# Patient Record
Sex: Female | Born: 1950 | Hispanic: Refuse to answer | Smoking: Never smoker
Health system: Southern US, Community
[De-identification: ages and names within clinical notes are randomized; demographics above are authoritative.]

## PROBLEM LIST (undated history)

## (undated) DIAGNOSIS — E079 Disorder of thyroid, unspecified: Secondary | ICD-10-CM

## (undated) DIAGNOSIS — I10 Essential (primary) hypertension: Secondary | ICD-10-CM

## (undated) DIAGNOSIS — E785 Hyperlipidemia, unspecified: Secondary | ICD-10-CM

## (undated) HISTORY — PX: TUBAL LIGATION: SHX77

---

## 2001-05-12 ENCOUNTER — Other Ambulatory Visit: Admission: RE | Admit: 2001-05-12 | Discharge: 2001-05-12 | Payer: Self-pay | Admitting: Obstetrics and Gynecology

## 2002-07-01 ENCOUNTER — Ambulatory Visit (HOSPITAL_COMMUNITY): Admission: RE | Admit: 2002-07-01 | Discharge: 2002-07-01 | Payer: Self-pay | Admitting: Obstetrics and Gynecology

## 2002-07-01 ENCOUNTER — Encounter: Payer: Self-pay | Admitting: Obstetrics and Gynecology

## 2002-09-24 ENCOUNTER — Encounter: Payer: Self-pay | Admitting: Obstetrics and Gynecology

## 2002-09-24 ENCOUNTER — Encounter: Admission: RE | Admit: 2002-09-24 | Discharge: 2002-09-24 | Payer: Self-pay | Admitting: Obstetrics and Gynecology

## 2005-01-04 ENCOUNTER — Ambulatory Visit (HOSPITAL_COMMUNITY): Admission: RE | Admit: 2005-01-04 | Discharge: 2005-01-04 | Payer: Self-pay | Admitting: Obstetrics and Gynecology

## 2006-03-06 ENCOUNTER — Ambulatory Visit (HOSPITAL_COMMUNITY): Admission: RE | Admit: 2006-03-06 | Discharge: 2006-03-06 | Payer: Self-pay | Admitting: Internal Medicine

## 2007-04-16 ENCOUNTER — Ambulatory Visit (HOSPITAL_COMMUNITY): Admission: RE | Admit: 2007-04-16 | Discharge: 2007-04-16 | Payer: Self-pay | Admitting: Family Medicine

## 2008-04-29 ENCOUNTER — Ambulatory Visit (HOSPITAL_COMMUNITY): Admission: RE | Admit: 2008-04-29 | Discharge: 2008-04-29 | Payer: Self-pay | Admitting: Internal Medicine

## 2009-04-28 ENCOUNTER — Ambulatory Visit (HOSPITAL_COMMUNITY): Admission: RE | Admit: 2009-04-28 | Discharge: 2009-04-28 | Payer: Self-pay | Admitting: Internal Medicine

## 2010-01-25 ENCOUNTER — Emergency Department (HOSPITAL_COMMUNITY): Admission: EM | Admit: 2010-01-25 | Discharge: 2010-01-25 | Payer: Self-pay | Admitting: Family Medicine

## 2010-07-12 ENCOUNTER — Other Ambulatory Visit (HOSPITAL_COMMUNITY): Payer: Self-pay | Admitting: Psychology

## 2010-07-12 DIAGNOSIS — Z1231 Encounter for screening mammogram for malignant neoplasm of breast: Secondary | ICD-10-CM

## 2010-07-26 ENCOUNTER — Ambulatory Visit (HOSPITAL_COMMUNITY)
Admission: RE | Admit: 2010-07-26 | Discharge: 2010-07-26 | Disposition: A | Discharge status: Home / Self Care | Payer: Self-pay | Source: Ambulatory Visit | Attending: Psychology | Admitting: Psychology

## 2010-07-26 DIAGNOSIS — Z1231 Encounter for screening mammogram for malignant neoplasm of breast: Secondary | ICD-10-CM

## 2011-07-04 ENCOUNTER — Other Ambulatory Visit (HOSPITAL_COMMUNITY): Payer: Self-pay | Admitting: Psychology

## 2011-07-04 DIAGNOSIS — Z1231 Encounter for screening mammogram for malignant neoplasm of breast: Secondary | ICD-10-CM

## 2011-07-30 ENCOUNTER — Ambulatory Visit (HOSPITAL_COMMUNITY)
Admission: RE | Admit: 2011-07-30 | Discharge: 2011-07-30 | Disposition: A | Discharge status: Home / Self Care | Payer: Self-pay | Source: Ambulatory Visit | Attending: Psychology | Admitting: Psychology

## 2011-07-30 DIAGNOSIS — Z1231 Encounter for screening mammogram for malignant neoplasm of breast: Secondary | ICD-10-CM | POA: Insufficient documentation

## 2012-07-02 ENCOUNTER — Other Ambulatory Visit (HOSPITAL_COMMUNITY): Payer: Self-pay | Admitting: Unknown Physician Specialty

## 2012-10-03 ENCOUNTER — Other Ambulatory Visit (HOSPITAL_COMMUNITY): Payer: Self-pay | Admitting: Unknown Physician Specialty

## 2012-10-03 ENCOUNTER — Other Ambulatory Visit (HOSPITAL_COMMUNITY): Payer: Self-pay | Admitting: *Deleted

## 2012-10-03 DIAGNOSIS — Z1231 Encounter for screening mammogram for malignant neoplasm of breast: Secondary | ICD-10-CM

## 2012-10-17 ENCOUNTER — Ambulatory Visit (HOSPITAL_COMMUNITY)
Admission: RE | Admit: 2012-10-17 | Discharge: 2012-10-17 | Disposition: A | Discharge status: Home / Self Care | Payer: Self-pay | Source: Ambulatory Visit | Attending: Unknown Physician Specialty | Admitting: Unknown Physician Specialty

## 2012-10-17 ENCOUNTER — Ambulatory Visit (HOSPITAL_COMMUNITY): Payer: Self-pay

## 2012-10-17 DIAGNOSIS — Z1231 Encounter for screening mammogram for malignant neoplasm of breast: Secondary | ICD-10-CM

## 2013-02-23 ENCOUNTER — Ambulatory Visit: Payer: Self-pay

## 2013-09-23 ENCOUNTER — Other Ambulatory Visit (HOSPITAL_COMMUNITY): Payer: Self-pay | Admitting: Unknown Physician Specialty

## 2013-10-20 ENCOUNTER — Other Ambulatory Visit (HOSPITAL_COMMUNITY): Payer: Self-pay | Admitting: Unknown Physician Specialty

## 2013-10-20 DIAGNOSIS — Z1231 Encounter for screening mammogram for malignant neoplasm of breast: Secondary | ICD-10-CM

## 2013-10-22 ENCOUNTER — Ambulatory Visit (HOSPITAL_COMMUNITY): Payer: Self-pay

## 2013-10-22 ENCOUNTER — Ambulatory Visit (HOSPITAL_COMMUNITY)
Admission: RE | Admit: 2013-10-22 | Discharge: 2013-10-22 | Disposition: A | Discharge status: Home / Self Care | Payer: Self-pay | Source: Ambulatory Visit | Attending: Unknown Physician Specialty | Admitting: Unknown Physician Specialty

## 2013-10-22 DIAGNOSIS — Z1231 Encounter for screening mammogram for malignant neoplasm of breast: Secondary | ICD-10-CM

## 2014-09-30 ENCOUNTER — Other Ambulatory Visit (HOSPITAL_COMMUNITY): Payer: Self-pay | Admitting: Unknown Physician Specialty

## 2014-10-25 ENCOUNTER — Other Ambulatory Visit (HOSPITAL_COMMUNITY): Payer: Self-pay | Admitting: Unknown Physician Specialty

## 2014-10-25 DIAGNOSIS — Z1231 Encounter for screening mammogram for malignant neoplasm of breast: Secondary | ICD-10-CM

## 2014-11-03 ENCOUNTER — Ambulatory Visit (HOSPITAL_COMMUNITY)
Admission: RE | Admit: 2014-11-03 | Discharge: 2014-11-03 | Disposition: A | Discharge status: Home / Self Care | Payer: Self-pay | Source: Ambulatory Visit | Attending: Unknown Physician Specialty | Admitting: Unknown Physician Specialty

## 2014-11-03 DIAGNOSIS — Z1231 Encounter for screening mammogram for malignant neoplasm of breast: Secondary | ICD-10-CM

## 2015-10-04 ENCOUNTER — Other Ambulatory Visit: Payer: Self-pay | Admitting: Family Medicine

## 2015-10-04 DIAGNOSIS — Z1231 Encounter for screening mammogram for malignant neoplasm of breast: Secondary | ICD-10-CM

## 2015-10-20 ENCOUNTER — Other Ambulatory Visit: Payer: Self-pay | Admitting: Obstetrics and Gynecology

## 2015-10-20 DIAGNOSIS — Z1231 Encounter for screening mammogram for malignant neoplasm of breast: Secondary | ICD-10-CM

## 2015-11-23 ENCOUNTER — Telehealth (HOSPITAL_COMMUNITY): Payer: Self-pay | Admitting: *Deleted

## 2015-11-23 NOTE — Telephone Encounter (Signed)
Telephoned patient at home number and someone picked up the phone and hung up. Patient then called backed and was able to confirm appointment for Thursday August 17 3:30.

## 2015-11-24 ENCOUNTER — Encounter (HOSPITAL_COMMUNITY): Payer: Self-pay

## 2015-11-24 ENCOUNTER — Ambulatory Visit
Admission: RE | Admit: 2015-11-24 | Discharge: 2015-11-24 | Disposition: A | Discharge status: Home / Self Care | Payer: No Typology Code available for payment source | Source: Ambulatory Visit | Attending: Obstetrics and Gynecology | Admitting: Obstetrics and Gynecology

## 2015-11-24 ENCOUNTER — Ambulatory Visit (HOSPITAL_COMMUNITY)
Admission: RE | Admit: 2015-11-24 | Discharge: 2015-11-24 | Disposition: A | Discharge status: Home / Self Care | Payer: Self-pay | Source: Ambulatory Visit | Attending: Obstetrics and Gynecology | Admitting: Obstetrics and Gynecology

## 2015-11-24 VITALS — BP 118/80 | Temp 98.5°F | Ht 63.5 in | Wt 215.0 lb

## 2015-11-24 DIAGNOSIS — Z1231 Encounter for screening mammogram for malignant neoplasm of breast: Secondary | ICD-10-CM

## 2015-11-24 DIAGNOSIS — Z01419 Encounter for gynecological examination (general) (routine) without abnormal findings: Secondary | ICD-10-CM

## 2015-11-24 HISTORY — DX: Essential (primary) hypertension: I10

## 2015-11-24 HISTORY — DX: Disorder of thyroid, unspecified: E07.9

## 2015-11-24 HISTORY — DX: Hyperlipidemia, unspecified: E78.5

## 2015-11-24 NOTE — Patient Instructions (Signed)
Explained breast self awareness to Lajoyce Lauber. Let her know [facility] will cover Pap smears and HPV typing every 5 years unless has a history of abnormal Pap smears. Referred patient to the [facility] for a screening mammogram. Appointment scheduled for Thursday, November 24, 2015 at 1640. Let [facility] know [facility] will follow up with her within the next couple weeks with results by letter or phone. Standley Brooking Cordero verbalized understanding.  Annette Bertelson, Arvil Chaco, RN 4:43 PM

## 2015-11-24 NOTE — Progress Notes (Signed)
No complaints today.   Pap Smear: Pap smear completed today. Last Pap smear was 10 years ago and normal per patient. Per patient has no history of an abnormal Pap smear. No Pap smear results are in EPIC.  Physical exam: Breasts Breasts symmetrical. No skin abnormalities bilateral breasts. No nipple retraction bilateral breasts. No nipple discharge bilateral breasts. No lymphadenopathy. No lumps palpated bilateral breasts. No complaints of pain or tenderness on exam. Referred patient to the [facility] for a screening mammogram. Appointment scheduled for Thursday, November 24, 2015 at 1640.  Pelvic/Bimanual   Ext Genitalia No lesions, no swelling and no discharge observed on external genitalia.         Vagina Vagina pink and normal texture. No lesions or discharge observed in vagina.          Cervix Cervix is present. Cervix pink and of normal texture. No discharge observed.     Uterus Uterus is present and palpable. Uterus in normal position and normal size.        Adnexae Bilateral ovaries present and palpable. No tenderness on palpation.          Rectovaginal No rectal exam completed today since patient had no rectal complaints. No skin abnormalities observed on exam.    Smoking History: Patient has never smoked.  Patient Navigation: Patient education provided. Access to services provided for patient through [facility] program.   Colorectal Cancer Screening: Patient has never had a colonoscopy. No complaints today.

## 2015-11-24 NOTE — Progress Notes (Signed)
.  pap

## 2015-11-28 ENCOUNTER — Encounter (HOSPITAL_COMMUNITY): Payer: Self-pay | Admitting: *Deleted

## 2015-11-28 LAB — CYTOLOGY - PAP

## 2015-11-29 ENCOUNTER — Telehealth (HOSPITAL_COMMUNITY): Payer: Self-pay | Admitting: *Deleted

## 2015-11-29 NOTE — Telephone Encounter (Signed)
Telephoned patient at home number and discussed negative pap smear results. HPV was negative. Next pap smear due in five years according to guide lines. Patient voiced understanding. No further questions.

## 2015-12-05 DIAGNOSIS — Z1211 Encounter for screening for malignant neoplasm of colon: Secondary | ICD-10-CM | POA: Diagnosis not present

## 2015-12-05 DIAGNOSIS — E039 Hypothyroidism, unspecified: Secondary | ICD-10-CM | POA: Diagnosis not present

## 2015-12-05 DIAGNOSIS — Z1389 Encounter for screening for other disorder: Secondary | ICD-10-CM | POA: Diagnosis not present

## 2015-12-05 DIAGNOSIS — Z9181 History of falling: Secondary | ICD-10-CM | POA: Diagnosis not present

## 2015-12-05 DIAGNOSIS — Z6838 Body mass index (BMI) 38.0-38.9, adult: Secondary | ICD-10-CM | POA: Diagnosis not present

## 2015-12-05 DIAGNOSIS — I10 Essential (primary) hypertension: Secondary | ICD-10-CM | POA: Diagnosis not present

## 2015-12-05 DIAGNOSIS — E785 Hyperlipidemia, unspecified: Secondary | ICD-10-CM | POA: Diagnosis not present

## 2015-12-05 DIAGNOSIS — E669 Obesity, unspecified: Secondary | ICD-10-CM | POA: Diagnosis not present

## 2015-12-20 DIAGNOSIS — T8131XS Disruption of external operation (surgical) wound, not elsewhere classified, sequela: Secondary | ICD-10-CM | POA: Diagnosis not present

## 2015-12-20 DIAGNOSIS — T8131XA Disruption of external operation (surgical) wound, not elsewhere classified, initial encounter: Secondary | ICD-10-CM | POA: Diagnosis not present

## 2015-12-20 DIAGNOSIS — Y838 Other surgical procedures as the cause of abnormal reaction of the patient, or of later complication, without mention of misadventure at the time of the procedure: Secondary | ICD-10-CM | POA: Diagnosis not present

## 2015-12-20 DIAGNOSIS — I10 Essential (primary) hypertension: Secondary | ICD-10-CM | POA: Diagnosis not present

## 2015-12-27 DIAGNOSIS — T8131XA Disruption of external operation (surgical) wound, not elsewhere classified, initial encounter: Secondary | ICD-10-CM | POA: Diagnosis not present

## 2015-12-27 DIAGNOSIS — Y838 Other surgical procedures as the cause of abnormal reaction of the patient, or of later complication, without mention of misadventure at the time of the procedure: Secondary | ICD-10-CM | POA: Diagnosis not present

## 2015-12-27 DIAGNOSIS — T8131XS Disruption of external operation (surgical) wound, not elsewhere classified, sequela: Secondary | ICD-10-CM | POA: Diagnosis not present

## 2015-12-27 DIAGNOSIS — S31609A Unspecified open wound of abdominal wall, unspecified quadrant with penetration into peritoneal cavity, initial encounter: Secondary | ICD-10-CM | POA: Diagnosis not present

## 2016-01-04 DIAGNOSIS — Y838 Other surgical procedures as the cause of abnormal reaction of the patient, or of later complication, without mention of misadventure at the time of the procedure: Secondary | ICD-10-CM | POA: Diagnosis not present

## 2016-01-04 DIAGNOSIS — T8131XS Disruption of external operation (surgical) wound, not elsewhere classified, sequela: Secondary | ICD-10-CM | POA: Diagnosis not present

## 2016-01-04 DIAGNOSIS — T8132XA Disruption of internal operation (surgical) wound, not elsewhere classified, initial encounter: Secondary | ICD-10-CM | POA: Diagnosis not present

## 2016-01-11 DIAGNOSIS — Z872 Personal history of diseases of the skin and subcutaneous tissue: Secondary | ICD-10-CM | POA: Diagnosis not present

## 2016-01-11 DIAGNOSIS — T8131XS Disruption of external operation (surgical) wound, not elsewhere classified, sequela: Secondary | ICD-10-CM | POA: Diagnosis not present

## 2016-01-11 DIAGNOSIS — Z09 Encounter for follow-up examination after completed treatment for conditions other than malignant neoplasm: Secondary | ICD-10-CM | POA: Diagnosis not present

## 2016-01-13 DIAGNOSIS — Z1211 Encounter for screening for malignant neoplasm of colon: Secondary | ICD-10-CM | POA: Diagnosis not present

## 2016-02-02 DIAGNOSIS — T8131XS Disruption of external operation (surgical) wound, not elsewhere classified, sequela: Secondary | ICD-10-CM | POA: Diagnosis not present

## 2016-02-02 DIAGNOSIS — Y838 Other surgical procedures as the cause of abnormal reaction of the patient, or of later complication, without mention of misadventure at the time of the procedure: Secondary | ICD-10-CM | POA: Diagnosis not present

## 2016-02-02 DIAGNOSIS — S31102A Unspecified open wound of abdominal wall, epigastric region without penetration into peritoneal cavity, initial encounter: Secondary | ICD-10-CM | POA: Diagnosis not present

## 2016-02-03 DIAGNOSIS — S31609A Unspecified open wound of abdominal wall, unspecified quadrant with penetration into peritoneal cavity, initial encounter: Secondary | ICD-10-CM | POA: Diagnosis not present

## 2016-02-07 DIAGNOSIS — Y838 Other surgical procedures as the cause of abnormal reaction of the patient, or of later complication, without mention of misadventure at the time of the procedure: Secondary | ICD-10-CM | POA: Diagnosis not present

## 2016-02-07 DIAGNOSIS — T8131XA Disruption of external operation (surgical) wound, not elsewhere classified, initial encounter: Secondary | ICD-10-CM | POA: Diagnosis not present

## 2016-02-07 DIAGNOSIS — T8131XS Disruption of external operation (surgical) wound, not elsewhere classified, sequela: Secondary | ICD-10-CM | POA: Diagnosis not present

## 2016-02-15 DIAGNOSIS — T8131XS Disruption of external operation (surgical) wound, not elsewhere classified, sequela: Secondary | ICD-10-CM | POA: Diagnosis not present

## 2016-02-15 DIAGNOSIS — Y838 Other surgical procedures as the cause of abnormal reaction of the patient, or of later complication, without mention of misadventure at the time of the procedure: Secondary | ICD-10-CM | POA: Diagnosis not present

## 2016-02-15 DIAGNOSIS — T8131XA Disruption of external operation (surgical) wound, not elsewhere classified, initial encounter: Secondary | ICD-10-CM | POA: Diagnosis not present

## 2016-02-22 DIAGNOSIS — Y839 Surgical procedure, unspecified as the cause of abnormal reaction of the patient, or of later complication, without mention of misadventure at the time of the procedure: Secondary | ICD-10-CM | POA: Diagnosis not present

## 2016-02-22 DIAGNOSIS — T8131XA Disruption of external operation (surgical) wound, not elsewhere classified, initial encounter: Secondary | ICD-10-CM | POA: Diagnosis not present

## 2016-02-22 DIAGNOSIS — S31102D Unspecified open wound of abdominal wall, epigastric region without penetration into peritoneal cavity, subsequent encounter: Secondary | ICD-10-CM | POA: Diagnosis not present

## 2016-02-29 DIAGNOSIS — T8131XD Disruption of external operation (surgical) wound, not elsewhere classified, subsequent encounter: Secondary | ICD-10-CM | POA: Diagnosis not present

## 2016-02-29 DIAGNOSIS — Y838 Other surgical procedures as the cause of abnormal reaction of the patient, or of later complication, without mention of misadventure at the time of the procedure: Secondary | ICD-10-CM | POA: Diagnosis not present

## 2016-02-29 DIAGNOSIS — S31102D Unspecified open wound of abdominal wall, epigastric region without penetration into peritoneal cavity, subsequent encounter: Secondary | ICD-10-CM | POA: Diagnosis not present

## 2016-02-29 DIAGNOSIS — T8131XA Disruption of external operation (surgical) wound, not elsewhere classified, initial encounter: Secondary | ICD-10-CM | POA: Diagnosis not present

## 2016-03-05 DIAGNOSIS — E039 Hypothyroidism, unspecified: Secondary | ICD-10-CM | POA: Diagnosis not present

## 2016-03-07 DIAGNOSIS — T8131XD Disruption of external operation (surgical) wound, not elsewhere classified, subsequent encounter: Secondary | ICD-10-CM | POA: Diagnosis not present

## 2016-03-07 DIAGNOSIS — Y838 Other surgical procedures as the cause of abnormal reaction of the patient, or of later complication, without mention of misadventure at the time of the procedure: Secondary | ICD-10-CM | POA: Diagnosis not present

## 2016-03-07 DIAGNOSIS — T8131XA Disruption of external operation (surgical) wound, not elsewhere classified, initial encounter: Secondary | ICD-10-CM | POA: Diagnosis not present

## 2016-03-13 DIAGNOSIS — Z872 Personal history of diseases of the skin and subcutaneous tissue: Secondary | ICD-10-CM | POA: Diagnosis not present

## 2016-03-13 DIAGNOSIS — Z09 Encounter for follow-up examination after completed treatment for conditions other than malignant neoplasm: Secondary | ICD-10-CM | POA: Diagnosis not present

## 2016-03-19 DIAGNOSIS — T8131XA Disruption of external operation (surgical) wound, not elsewhere classified, initial encounter: Secondary | ICD-10-CM | POA: Diagnosis not present

## 2016-03-19 DIAGNOSIS — T8131XD Disruption of external operation (surgical) wound, not elsewhere classified, subsequent encounter: Secondary | ICD-10-CM | POA: Diagnosis not present

## 2016-03-19 DIAGNOSIS — Z48817 Encounter for surgical aftercare following surgery on the skin and subcutaneous tissue: Secondary | ICD-10-CM | POA: Diagnosis not present

## 2016-03-19 DIAGNOSIS — Y838 Other surgical procedures as the cause of abnormal reaction of the patient, or of later complication, without mention of misadventure at the time of the procedure: Secondary | ICD-10-CM | POA: Diagnosis not present

## 2016-03-22 DIAGNOSIS — Y838 Other surgical procedures as the cause of abnormal reaction of the patient, or of later complication, without mention of misadventure at the time of the procedure: Secondary | ICD-10-CM | POA: Diagnosis not present

## 2016-03-22 DIAGNOSIS — S31104A Unspecified open wound of abdominal wall, left lower quadrant without penetration into peritoneal cavity, initial encounter: Secondary | ICD-10-CM | POA: Diagnosis not present

## 2016-03-22 DIAGNOSIS — T8131XA Disruption of external operation (surgical) wound, not elsewhere classified, initial encounter: Secondary | ICD-10-CM | POA: Diagnosis not present

## 2016-03-29 DIAGNOSIS — L905 Scar conditions and fibrosis of skin: Secondary | ICD-10-CM | POA: Diagnosis not present

## 2016-03-29 DIAGNOSIS — T8131XA Disruption of external operation (surgical) wound, not elsewhere classified, initial encounter: Secondary | ICD-10-CM | POA: Diagnosis not present

## 2016-03-29 DIAGNOSIS — Z48817 Encounter for surgical aftercare following surgery on the skin and subcutaneous tissue: Secondary | ICD-10-CM | POA: Diagnosis not present

## 2016-04-06 DIAGNOSIS — Y838 Other surgical procedures as the cause of abnormal reaction of the patient, or of later complication, without mention of misadventure at the time of the procedure: Secondary | ICD-10-CM | POA: Diagnosis not present

## 2016-04-06 DIAGNOSIS — T8131XD Disruption of external operation (surgical) wound, not elsewhere classified, subsequent encounter: Secondary | ICD-10-CM | POA: Diagnosis not present

## 2016-04-06 DIAGNOSIS — S31104A Unspecified open wound of abdominal wall, left lower quadrant without penetration into peritoneal cavity, initial encounter: Secondary | ICD-10-CM | POA: Diagnosis not present

## 2016-04-13 DIAGNOSIS — L089 Local infection of the skin and subcutaneous tissue, unspecified: Secondary | ICD-10-CM | POA: Diagnosis not present

## 2016-04-13 DIAGNOSIS — Z48817 Encounter for surgical aftercare following surgery on the skin and subcutaneous tissue: Secondary | ICD-10-CM | POA: Diagnosis not present

## 2016-04-13 DIAGNOSIS — T8130XA Disruption of wound, unspecified, initial encounter: Secondary | ICD-10-CM | POA: Diagnosis not present

## 2016-04-13 DIAGNOSIS — S31104A Unspecified open wound of abdominal wall, left lower quadrant without penetration into peritoneal cavity, initial encounter: Secondary | ICD-10-CM | POA: Diagnosis not present

## 2016-04-17 DIAGNOSIS — Y838 Other surgical procedures as the cause of abnormal reaction of the patient, or of later complication, without mention of misadventure at the time of the procedure: Secondary | ICD-10-CM | POA: Diagnosis not present

## 2016-04-17 DIAGNOSIS — T8131XA Disruption of external operation (surgical) wound, not elsewhere classified, initial encounter: Secondary | ICD-10-CM | POA: Diagnosis not present

## 2016-04-23 DIAGNOSIS — Z48817 Encounter for surgical aftercare following surgery on the skin and subcutaneous tissue: Secondary | ICD-10-CM | POA: Diagnosis not present

## 2016-04-23 DIAGNOSIS — S31109A Unspecified open wound of abdominal wall, unspecified quadrant without penetration into peritoneal cavity, initial encounter: Secondary | ICD-10-CM | POA: Diagnosis not present

## 2016-04-23 DIAGNOSIS — Z885 Allergy status to narcotic agent status: Secondary | ICD-10-CM | POA: Diagnosis not present

## 2016-04-23 DIAGNOSIS — T8131XA Disruption of external operation (surgical) wound, not elsewhere classified, initial encounter: Secondary | ICD-10-CM | POA: Diagnosis not present

## 2016-04-23 DIAGNOSIS — T8130XA Disruption of wound, unspecified, initial encounter: Secondary | ICD-10-CM | POA: Diagnosis not present

## 2016-04-30 DIAGNOSIS — T8131XA Disruption of external operation (surgical) wound, not elsewhere classified, initial encounter: Secondary | ICD-10-CM | POA: Diagnosis not present

## 2016-04-30 DIAGNOSIS — S31109D Unspecified open wound of abdominal wall, unspecified quadrant without penetration into peritoneal cavity, subsequent encounter: Secondary | ICD-10-CM | POA: Diagnosis not present

## 2016-04-30 DIAGNOSIS — X58XXXD Exposure to other specified factors, subsequent encounter: Secondary | ICD-10-CM | POA: Diagnosis not present

## 2016-04-30 DIAGNOSIS — Z48817 Encounter for surgical aftercare following surgery on the skin and subcutaneous tissue: Secondary | ICD-10-CM | POA: Diagnosis not present

## 2016-04-30 DIAGNOSIS — T8130XD Disruption of wound, unspecified, subsequent encounter: Secondary | ICD-10-CM | POA: Diagnosis not present

## 2016-05-07 DIAGNOSIS — T8131XA Disruption of external operation (surgical) wound, not elsewhere classified, initial encounter: Secondary | ICD-10-CM | POA: Diagnosis not present

## 2016-05-07 DIAGNOSIS — S31109A Unspecified open wound of abdominal wall, unspecified quadrant without penetration into peritoneal cavity, initial encounter: Secondary | ICD-10-CM | POA: Diagnosis not present

## 2016-05-07 DIAGNOSIS — T8130XA Disruption of wound, unspecified, initial encounter: Secondary | ICD-10-CM | POA: Diagnosis not present

## 2016-05-07 DIAGNOSIS — Y838 Other surgical procedures as the cause of abnormal reaction of the patient, or of later complication, without mention of misadventure at the time of the procedure: Secondary | ICD-10-CM | POA: Diagnosis not present

## 2016-05-15 DIAGNOSIS — Z48817 Encounter for surgical aftercare following surgery on the skin and subcutaneous tissue: Secondary | ICD-10-CM | POA: Diagnosis not present

## 2016-05-15 DIAGNOSIS — S31109A Unspecified open wound of abdominal wall, unspecified quadrant without penetration into peritoneal cavity, initial encounter: Secondary | ICD-10-CM | POA: Diagnosis not present

## 2016-05-15 DIAGNOSIS — R1030 Lower abdominal pain, unspecified: Secondary | ICD-10-CM | POA: Diagnosis not present

## 2016-05-15 DIAGNOSIS — Y838 Other surgical procedures as the cause of abnormal reaction of the patient, or of later complication, without mention of misadventure at the time of the procedure: Secondary | ICD-10-CM | POA: Diagnosis not present

## 2016-05-15 DIAGNOSIS — T8131XA Disruption of external operation (surgical) wound, not elsewhere classified, initial encounter: Secondary | ICD-10-CM | POA: Diagnosis not present

## 2016-05-22 DIAGNOSIS — Y839 Surgical procedure, unspecified as the cause of abnormal reaction of the patient, or of later complication, without mention of misadventure at the time of the procedure: Secondary | ICD-10-CM | POA: Diagnosis not present

## 2016-05-22 DIAGNOSIS — T8131XA Disruption of external operation (surgical) wound, not elsewhere classified, initial encounter: Secondary | ICD-10-CM | POA: Diagnosis not present

## 2016-05-22 DIAGNOSIS — S31109A Unspecified open wound of abdominal wall, unspecified quadrant without penetration into peritoneal cavity, initial encounter: Secondary | ICD-10-CM | POA: Diagnosis not present

## 2016-05-22 DIAGNOSIS — Z48817 Encounter for surgical aftercare following surgery on the skin and subcutaneous tissue: Secondary | ICD-10-CM | POA: Diagnosis not present

## 2016-05-29 DIAGNOSIS — S31109D Unspecified open wound of abdominal wall, unspecified quadrant without penetration into peritoneal cavity, subsequent encounter: Secondary | ICD-10-CM | POA: Diagnosis not present

## 2016-05-29 DIAGNOSIS — Y839 Surgical procedure, unspecified as the cause of abnormal reaction of the patient, or of later complication, without mention of misadventure at the time of the procedure: Secondary | ICD-10-CM | POA: Diagnosis not present

## 2016-05-29 DIAGNOSIS — T8131XA Disruption of external operation (surgical) wound, not elsewhere classified, initial encounter: Secondary | ICD-10-CM | POA: Diagnosis not present

## 2016-05-29 DIAGNOSIS — Z48817 Encounter for surgical aftercare following surgery on the skin and subcutaneous tissue: Secondary | ICD-10-CM | POA: Diagnosis not present

## 2016-06-04 DIAGNOSIS — Y838 Other surgical procedures as the cause of abnormal reaction of the patient, or of later complication, without mention of misadventure at the time of the procedure: Secondary | ICD-10-CM | POA: Diagnosis not present

## 2016-06-04 DIAGNOSIS — T8131XA Disruption of external operation (surgical) wound, not elsewhere classified, initial encounter: Secondary | ICD-10-CM | POA: Diagnosis not present

## 2016-06-04 DIAGNOSIS — S31109A Unspecified open wound of abdominal wall, unspecified quadrant without penetration into peritoneal cavity, initial encounter: Secondary | ICD-10-CM | POA: Diagnosis not present

## 2016-06-11 DIAGNOSIS — E785 Hyperlipidemia, unspecified: Secondary | ICD-10-CM | POA: Diagnosis not present

## 2016-06-11 DIAGNOSIS — E039 Hypothyroidism, unspecified: Secondary | ICD-10-CM | POA: Diagnosis not present

## 2016-06-11 DIAGNOSIS — Z6839 Body mass index (BMI) 39.0-39.9, adult: Secondary | ICD-10-CM | POA: Diagnosis not present

## 2016-06-11 DIAGNOSIS — E669 Obesity, unspecified: Secondary | ICD-10-CM | POA: Diagnosis not present

## 2016-06-11 DIAGNOSIS — I10 Essential (primary) hypertension: Secondary | ICD-10-CM | POA: Diagnosis not present

## 2016-06-12 ENCOUNTER — Ambulatory Visit (INDEPENDENT_AMBULATORY_CARE_PROVIDER_SITE_OTHER): Payer: PPO | Admitting: Sports Medicine

## 2016-06-12 ENCOUNTER — Encounter: Payer: Self-pay | Admitting: Sports Medicine

## 2016-06-12 ENCOUNTER — Ambulatory Visit (INDEPENDENT_AMBULATORY_CARE_PROVIDER_SITE_OTHER): Payer: PPO

## 2016-06-12 DIAGNOSIS — M79672 Pain in left foot: Secondary | ICD-10-CM

## 2016-06-12 DIAGNOSIS — S31109D Unspecified open wound of abdominal wall, unspecified quadrant without penetration into peritoneal cavity, subsequent encounter: Secondary | ICD-10-CM | POA: Diagnosis not present

## 2016-06-12 DIAGNOSIS — M722 Plantar fascial fibromatosis: Secondary | ICD-10-CM

## 2016-06-12 DIAGNOSIS — Y838 Other surgical procedures as the cause of abnormal reaction of the patient, or of later complication, without mention of misadventure at the time of the procedure: Secondary | ICD-10-CM | POA: Diagnosis not present

## 2016-06-12 DIAGNOSIS — T8131XA Disruption of external operation (surgical) wound, not elsewhere classified, initial encounter: Secondary | ICD-10-CM | POA: Diagnosis not present

## 2016-06-12 MED ORDER — METHYLPREDNISOLONE 4 MG PO TBPK: ORAL_TABLET | ORAL | 0 refills | Status: DC

## 2016-06-12 MED ORDER — MELOXICAM 15 MG PO TABS: 15.0000 mg | ORAL_TABLET | Freq: Every day | ORAL | 0 refills | Status: DC

## 2016-06-12 NOTE — Patient Instructions (Signed)

## 2016-06-12 NOTE — Progress Notes (Signed)
Subjective: Joanna Price is a 66 y.o. female patient presents to office with complaint of heel pain on the left. Patient admits to post static dyskinesia for 6 months in duration. Patient has treated this problem with topical rub and change in shoes with no relief. Admits to painless cyst on right 3rd toe. Denies any other pedal complaints.   There are no active problems to display for this patient.   Current Outpatient Prescriptions on File Prior to Visit  Medication Sig Dispense Refill  . levothyroxine (SYNTHROID, LEVOTHROID) 25 MCG tablet Take 25 mcg by mouth daily before breakfast.    . lisinopril (PRINIVIL,ZESTRIL) 10 MG tablet Take 10 mg by mouth daily.    . pravastatin (PRAVACHOL) 20 MG tablet Take 20 mg by mouth daily.     No current facility-administered medications on file prior to visit.     Allergies  Allergen Reactions  . Codeine     Objective: Physical Exam General: The patient is alert and oriented x3 in no acute distress.  Dermatology: Skin is warm, dry and supple bilateral lower extremities. Nails 1-10 are normal. There is no erythema, edema, no eccymosis, no open lesions present. Cyst at right 3rd toe dorsal aspect with no acute findings. Integument is otherwise unremarkable.  Vascular: Dorsalis Pedis pulse and Posterior Tibial pulse are 2/4 bilateral. Capillary fill time is immediate to all digits.  Neurological: Grossly intact to light touch with an achilles reflex of +2/5 and a  negative Tinel's sign bilateral.  Musculoskeletal: Tenderness to palpation at the medial calcaneal tubercale and through the insertion of the plantar fascia on the left foot. No pain with compression of calcaneus bilateral. No pain with tuning fork to calcaneus bilateral. No pain with calf compression bilateral. There is decreased Ankle joint range of motion bilateral. All other joints range of motion within normal limits bilateral. Strength 5/5 in all groups bilateral.   Gait:  Unassisted, Antalgic avoid weight on left heel  Xray, Left foot:  Normal osseous mineralization. Joint spaces preserved. No fracture/dislocation/boney destruction. Calcaneal spur present with mild thickening of plantar fascia. No other soft tissue abnormalities or radiopaque foreign bodies.   Assessment and Plan: Problem List Items Addressed This Visit    None    Visit Diagnoses    Left foot pain    -  Primary   Relevant Medications   methylPREDNISolone (MEDROL DOSEPAK) 4 MG TBPK tablet   meloxicam (MOBIC) 15 MG tablet   Other Relevant Orders   DG Foot 2 Views Left   Plantar fasciitis       Relevant Medications   methylPREDNISolone (MEDROL DOSEPAK) 4 MG TBPK tablet   meloxicam (MOBIC) 15 MG tablet      -Complete examination performed.  -Xrays reviewed -Discussed with patient in detail the condition of plantar fasciitis, how this occurs and general treatment options. Explained both conservative and surgical treatments.  -Patient declined injection.  -Rx Meloxicam to start after Medrol dose pack is completed -Recommended good supportive shoes and advised use of OTC insert. Explained to patient that if these orthoses work well, we will continue with these. If these do not improve her condition and  pain, we will consider custom molded orthoses. - Explained in detail the use of the fascial brace; will dispense at next visit once authorized by insurance  -Explained and dispensed to patient daily stretching exercises. -Recommend patient to ice affected area 1-2x daily. -Patient to return to office in 4 weeks for follow up or sooner if problems  or questions arise. Will monitor right 3rd toe cyst.   Landis Martins, DPM

## 2016-06-18 DIAGNOSIS — T8131XA Disruption of external operation (surgical) wound, not elsewhere classified, initial encounter: Secondary | ICD-10-CM | POA: Diagnosis not present

## 2016-06-18 DIAGNOSIS — Y838 Other surgical procedures as the cause of abnormal reaction of the patient, or of later complication, without mention of misadventure at the time of the procedure: Secondary | ICD-10-CM | POA: Diagnosis not present

## 2016-06-21 DIAGNOSIS — L089 Local infection of the skin and subcutaneous tissue, unspecified: Secondary | ICD-10-CM | POA: Diagnosis not present

## 2016-06-21 DIAGNOSIS — S31109A Unspecified open wound of abdominal wall, unspecified quadrant without penetration into peritoneal cavity, initial encounter: Secondary | ICD-10-CM | POA: Diagnosis not present

## 2016-06-27 ENCOUNTER — Encounter: Payer: Self-pay | Admitting: Sports Medicine

## 2016-06-27 ENCOUNTER — Ambulatory Visit (INDEPENDENT_AMBULATORY_CARE_PROVIDER_SITE_OTHER): Payer: PPO | Admitting: Sports Medicine

## 2016-06-27 DIAGNOSIS — M722 Plantar fascial fibromatosis: Secondary | ICD-10-CM

## 2016-06-27 DIAGNOSIS — M79672 Pain in left foot: Secondary | ICD-10-CM

## 2016-06-27 MED ORDER — TRIAMCINOLONE ACETONIDE 40 MG/ML IJ SUSP: 20.0000 mg | Freq: Once | INTRAMUSCULAR | Status: AC

## 2016-06-27 NOTE — Progress Notes (Signed)
  Subjective: Joanna Price is a 66 y.o. female patient returns to office with complaint of continued heel pain on the left. Patient tried oral medication but now is more amenable to injection. Denies any other pedal complaints.   There are no active problems to display for this patient.   Current Outpatient Prescriptions on File Prior to Visit  Medication Sig Dispense Refill  . levothyroxine (SYNTHROID, LEVOTHROID) 25 MCG tablet Take 25 mcg by mouth daily before breakfast.    . lisinopril (PRINIVIL,ZESTRIL) 10 MG tablet Take 10 mg by mouth daily.    . meloxicam (MOBIC) 15 MG tablet Take 1 tablet (15 mg total) by mouth daily. 30 tablet 0  . methylPREDNISolone (MEDROL DOSEPAK) 4 MG TBPK tablet Take as instructed 21 tablet 0  . pravastatin (PRAVACHOL) 20 MG tablet Take 20 mg by mouth daily.     No current facility-administered medications on file prior to visit.     Allergies  Allergen Reactions  . Codeine     Objective: Physical Exam General: The patient is alert and oriented x3 in no acute distress.  Dermatology: Skin is warm, dry and supple bilateral lower extremities. Nails 1-10 are normal. There is no erythema, edema, no eccymosis, no open lesions present. Cyst at right 3rd toe dorsal aspect with no acute findings. Integument is otherwise unremarkable.  Vascular: Dorsalis Pedis pulse and Posterior Tibial pulse are 2/4 bilateral. Capillary fill time is immediate to all digits.  Neurological: Grossly intact to light touch with an achilles reflex of +2/5 and a  negative Tinel's sign bilateral.  Musculoskeletal: Tenderness to palpation at the medial calcaneal tubercale and through the insertion of the plantar fascia on the left foot. No pain with compression of calcaneus bilateral. No pain with tuning fork to calcaneus bilateral. No pain with calf compression bilateral. There is decreased Ankle joint range of motion bilateral. All other joints range of motion within normal limits  bilateral. Strength 5/5 in all groups bilateral.   Assessment and Plan: Problem List Items Addressed This Visit    None    Visit Diagnoses    Plantar fasciitis    -  Primary   Relevant Medications   triamcinolone acetonide (KENALOG-40) injection 20 mg (Start on 06/27/2016  7:15 PM)   Left foot pain       Relevant Medications   triamcinolone acetonide (KENALOG-40) injection 20 mg (Start on 06/27/2016  7:15 PM)      -Complete examination performed.  -Previous Xrays reviewed -Re-Discussed with patient in detail the condition of plantar fasciitis, how this occurs and general treatment options. Explained both conservative and surgical treatments.  -After oral consent and aseptic prep, injected a mixture containing 1 ml of 2% plain lidocaine, 1 ml 0.5% plain marcaine, 0.5 ml of kenalog 40 and 0.5 ml of dexamethasone phosphate into left heel at plantar fascia medially without complication. Post-injection care discussed with patient.  -Recommended  Continue with good supportive shoes and advised use of OTC insert.Dispensed heel lifts.  - Explained in detail the use of the fascial brace; will dispense at next visit once authorized by insurance; So far not approved  -Continue daily stretching exercises. -Recommend patient to ice affected area 1-2x daily. -Patient to return to office in 4 weeks for follow up or sooner if problems or questions arise. Will continue to monitor right 3rd toe cyst.   Landis Martins, DPM

## 2016-07-10 ENCOUNTER — Ambulatory Visit: Payer: PPO | Admitting: Sports Medicine

## 2016-07-16 DIAGNOSIS — S31109A Unspecified open wound of abdominal wall, unspecified quadrant without penetration into peritoneal cavity, initial encounter: Secondary | ICD-10-CM | POA: Diagnosis not present

## 2016-07-16 DIAGNOSIS — Z79899 Other long term (current) drug therapy: Secondary | ICD-10-CM | POA: Diagnosis not present

## 2016-07-16 DIAGNOSIS — E039 Hypothyroidism, unspecified: Secondary | ICD-10-CM | POA: Diagnosis not present

## 2016-07-16 DIAGNOSIS — E7800 Pure hypercholesterolemia, unspecified: Secondary | ICD-10-CM | POA: Diagnosis not present

## 2016-07-16 DIAGNOSIS — Y838 Other surgical procedures as the cause of abnormal reaction of the patient, or of later complication, without mention of misadventure at the time of the procedure: Secondary | ICD-10-CM | POA: Diagnosis not present

## 2016-07-16 DIAGNOSIS — T814XXA Infection following a procedure, initial encounter: Secondary | ICD-10-CM | POA: Diagnosis not present

## 2016-07-16 DIAGNOSIS — L0889 Other specified local infections of the skin and subcutaneous tissue: Secondary | ICD-10-CM | POA: Diagnosis not present

## 2016-07-16 DIAGNOSIS — I10 Essential (primary) hypertension: Secondary | ICD-10-CM | POA: Diagnosis not present

## 2016-07-16 DIAGNOSIS — L02211 Cutaneous abscess of abdominal wall: Secondary | ICD-10-CM | POA: Diagnosis not present

## 2016-07-25 ENCOUNTER — Ambulatory Visit: Payer: PPO | Admitting: Sports Medicine

## 2016-07-26 ENCOUNTER — Ambulatory Visit (INDEPENDENT_AMBULATORY_CARE_PROVIDER_SITE_OTHER): Payer: PPO | Admitting: Sports Medicine

## 2016-07-26 ENCOUNTER — Encounter: Payer: Self-pay | Admitting: Sports Medicine

## 2016-07-26 DIAGNOSIS — M79672 Pain in left foot: Secondary | ICD-10-CM

## 2016-07-26 DIAGNOSIS — M722 Plantar fascial fibromatosis: Secondary | ICD-10-CM

## 2016-07-26 NOTE — Progress Notes (Signed)
Subjective: Joanna Price is a 66 y.o. female returns to office for follow up evaluation after Left heel injection for plantar fasciitis, injection #1 administered 4 weeks ago. Patient states that the injection seems to help her pain; pain is now 2/10 and has  decreased in frequency to the area. Patient denies any recent changes in medications or new problems since last visit.   There are no active problems to display for this patient.   Current Outpatient Prescriptions on File Prior to Visit  Medication Sig Dispense Refill  . levothyroxine (SYNTHROID, LEVOTHROID) 25 MCG tablet Take 25 mcg by mouth daily before breakfast.    . lisinopril (PRINIVIL,ZESTRIL) 10 MG tablet Take 10 mg by mouth daily.    . meloxicam (MOBIC) 15 MG tablet Take 1 tablet (15 mg total) by mouth daily. 30 tablet 0  . methylPREDNISolone (MEDROL DOSEPAK) 4 MG TBPK tablet Take as instructed 21 tablet 0  . pravastatin (PRAVACHOL) 20 MG tablet Take 20 mg by mouth daily.     Current Facility-Administered Medications on File Prior to Visit  Medication Dose Route Frequency Provider Last Rate Last Dose  . triamcinolone acetonide (KENALOG-40) injection 20 mg  20 mg Other Once Landis Martins, DPM        Allergies  Allergen Reactions  . Codeine     Objective:   General:  Alert and oriented x 3, in no acute distress  Dermatology: Skin is warm, dry, and supple bilateral. Nails are within normal limits. There is no lower extremity erythema, no eccymosis, no open lesions present bilateral. Right 3rd toe cyst unchanged.  Vascular: Dorsalis Pedis and Posterior Tibial pedal pulses are 2/4 bilateral. + hair growth noted bilateral. Capillary Fill Time is 3 seconds in all digits. No varicosities, No edema bilateral lower extremities.   Neurological: Sensation grossly intact to light touch with an achilles reflex of +2 and a negative Tinel's sign bilateral. Vibratory, sharp/dull, Semmes Weinstein Monofilament within normal limits.    Musculoskeletal: There is decreased tenderness to palpation at the medial calcaneal tubercale and through the insertion of the plantar fascia on the Left foot. No pain with compression to calcaneus or application of tuning fork. There is decreased Ankle joint range of motion bilateral. All other jointsrange of motion  within normal limits bilateral. Strength 5/5 bilateral.   Assessment and Plan: Problem List Items Addressed This Visit    None    Visit Diagnoses    Plantar fasciitis    -  Primary   Left foot pain          -Complete examination performed.  -Previous x-rays reviewed. -Re-Discussed with patient in detail the condition of plantar fasciitis, how this  occurs related to the foot type of the patient and general treatment options. -Continue with stretching, icing, good supportive shoes, inserts daily.  -Discussed long term care and reocurrence; will closely monitor; if fails to improve will consider other treatment modalities.  -Patient to return to as needed for follow up or sooner if problems or questions arise. Will monitor right 3rd toe cyst.   Landis Martins, DPM

## 2016-08-16 DIAGNOSIS — N950 Postmenopausal bleeding: Secondary | ICD-10-CM | POA: Diagnosis not present

## 2016-08-17 ENCOUNTER — Ambulatory Visit: Payer: PPO | Admitting: Sports Medicine

## 2016-08-17 ENCOUNTER — Encounter: Payer: Self-pay | Admitting: Sports Medicine

## 2016-08-17 ENCOUNTER — Ambulatory Visit (INDEPENDENT_AMBULATORY_CARE_PROVIDER_SITE_OTHER): Payer: PPO | Admitting: Sports Medicine

## 2016-08-17 DIAGNOSIS — M722 Plantar fascial fibromatosis: Secondary | ICD-10-CM | POA: Diagnosis not present

## 2016-08-17 DIAGNOSIS — M79672 Pain in left foot: Secondary | ICD-10-CM

## 2016-08-17 MED ORDER — TRIAMCINOLONE ACETONIDE 10 MG/ML IJ SUSP: 10.0000 mg | Freq: Once | INTRAMUSCULAR | Status: AC

## 2016-08-17 MED ORDER — MELOXICAM 15 MG PO TABS: 15.0000 mg | ORAL_TABLET | Freq: Every day | ORAL | 0 refills | Status: DC

## 2016-08-17 MED ORDER — METHYLPREDNISOLONE 4 MG PO TBPK: ORAL_TABLET | ORAL | 0 refills | Status: DC

## 2016-08-18 NOTE — Progress Notes (Signed)
Subjective: Joanna Price is a 66 y.o. female returns to office for follow up evaluation after Left heel injection for plantar fasciitis, injection #1 administered 7 weeks ago. Patient states that the injection seems to help her pain but now pain is back and is worse. Patient denies any recent changes in medications or new problems since last visit.   There are no active problems to display for this patient.   Current Outpatient Prescriptions on File Prior to Visit  Medication Sig Dispense Refill  . levothyroxine (SYNTHROID, LEVOTHROID) 25 MCG tablet Take 25 mcg by mouth daily before breakfast.    . lisinopril (PRINIVIL,ZESTRIL) 10 MG tablet Take 10 mg by mouth daily.    . pravastatin (PRAVACHOL) 20 MG tablet Take 20 mg by mouth daily.     Current Facility-Administered Medications on File Prior to Visit  Medication Dose Route Frequency Provider Last Rate Last Dose  . triamcinolone acetonide (KENALOG-40) injection 20 mg  20 mg Other Once Landis Martins, DPM        Allergies  Allergen Reactions  . Codeine     Objective:   General:  Alert and oriented x 3, in no acute distress  Dermatology: Skin is warm, dry, and supple bilateral. Nails are within normal limits. There is no lower extremity erythema, no eccymosis, no open lesions present bilateral. Right 3rd toe cyst unchanged.  Vascular: Dorsalis Pedis and Posterior Tibial pedal pulses are 2/4 bilateral. + hair growth noted bilateral. Capillary Fill Time is 3 seconds in all digits. No varicosities, No edema bilateral lower extremities.   Neurological: Sensation grossly intact to light touch with an achilles reflex of +2 and a negative Tinel's sign bilateral. Vibratory, sharp/dull, Semmes Weinstein Monofilament within normal limits.   Musculoskeletal: There is  tenderness to palpation at the medial calcaneal tubercale and through the insertion of the plantar fascia on the Left foot. No pain with compression to calcaneus or application  of tuning fork. There is decreased Ankle joint range of motion bilateral. All other jointsrange of motion  within normal limits bilateral. Strength 5/5 bilateral.   Assessment and Plan: Problem List Items Addressed This Visit    None    Visit Diagnoses    Plantar fasciitis of left foot    -  Primary   Relevant Medications   triamcinolone acetonide (KENALOG) 10 MG/ML injection 10 mg   methylPREDNISolone (MEDROL DOSEPAK) 4 MG TBPK tablet   Left foot pain       Relevant Medications   triamcinolone acetonide (KENALOG) 10 MG/ML injection 10 mg   meloxicam (MOBIC) 15 MG tablet      -Complete examination performed.  -Previous x-rays reviewed. -Re-Discussed with patient in detail the condition of plantar fasciitis, how this  occurs related to the foot type of the patient and general treatment options. After oral consent and aseptic prep, injected a mixture containing 1 ml of 2%  plain lidocaine, 1 ml 0.5% plain marcaine, 0.5 ml of kenalog 10 and 0.5 ml of dexamethasone phosphate into Left heel without complication. Post-injection care discussed with patient.  -Refilled Medrol and Mobic  -Continue with stretching, icing, good supportive shoes, inserts daily.  -Discussed long term care and reocurrence; will closely monitor; if fails to improve will consider other treatment modalities.  -Patient to return to 3-4 weeks for follow up of left heel pain. Will monitor right 3rd toe cyst.   Landis Martins, DPM

## 2016-08-20 DIAGNOSIS — N950 Postmenopausal bleeding: Secondary | ICD-10-CM | POA: Diagnosis not present

## 2016-09-11 DIAGNOSIS — R938 Abnormal findings on diagnostic imaging of other specified body structures: Secondary | ICD-10-CM | POA: Diagnosis not present

## 2016-09-11 DIAGNOSIS — N950 Postmenopausal bleeding: Secondary | ICD-10-CM | POA: Diagnosis not present

## 2016-09-12 ENCOUNTER — Ambulatory Visit: Payer: PPO | Admitting: Sports Medicine

## 2016-09-13 DIAGNOSIS — J209 Acute bronchitis, unspecified: Secondary | ICD-10-CM | POA: Diagnosis not present

## 2016-10-09 DIAGNOSIS — N950 Postmenopausal bleeding: Secondary | ICD-10-CM | POA: Diagnosis not present

## 2016-10-09 DIAGNOSIS — E785 Hyperlipidemia, unspecified: Secondary | ICD-10-CM | POA: Diagnosis not present

## 2016-10-09 DIAGNOSIS — N840 Polyp of corpus uteri: Secondary | ICD-10-CM | POA: Diagnosis not present

## 2016-10-09 DIAGNOSIS — E669 Obesity, unspecified: Secondary | ICD-10-CM | POA: Diagnosis not present

## 2016-10-09 DIAGNOSIS — Z79899 Other long term (current) drug therapy: Secondary | ICD-10-CM | POA: Diagnosis not present

## 2016-10-09 DIAGNOSIS — I10 Essential (primary) hypertension: Secondary | ICD-10-CM | POA: Diagnosis not present

## 2016-10-09 DIAGNOSIS — R938 Abnormal findings on diagnostic imaging of other specified body structures: Secondary | ICD-10-CM | POA: Diagnosis not present

## 2016-10-09 DIAGNOSIS — E039 Hypothyroidism, unspecified: Secondary | ICD-10-CM | POA: Diagnosis not present

## 2016-11-01 ENCOUNTER — Ambulatory Visit (INDEPENDENT_AMBULATORY_CARE_PROVIDER_SITE_OTHER): Payer: PPO | Admitting: Sports Medicine

## 2016-11-01 DIAGNOSIS — M722 Plantar fascial fibromatosis: Secondary | ICD-10-CM | POA: Diagnosis not present

## 2016-11-01 DIAGNOSIS — M79672 Pain in left foot: Secondary | ICD-10-CM

## 2016-11-01 MED ORDER — TRIAMCINOLONE ACETONIDE 10 MG/ML IJ SUSP: 10.0000 mg | Freq: Once | INTRAMUSCULAR | Status: AC

## 2016-11-01 NOTE — Progress Notes (Signed)
Subjective: Joanna Price is a 66 y.o. female returns to office for follow up evaluation after Left heel injection for plantar fasciitis, injection #2 administered 10 weeks ago. Patient states that the injection seems to help her pain but now pain is back, came back about 3 weeks ago. Patient denies any recent changes in medications or new problems since last visit.   There are no active problems to display for this patient.   Current Outpatient Prescriptions on File Prior to Visit  Medication Sig Dispense Refill  . levothyroxine (SYNTHROID, LEVOTHROID) 25 MCG tablet Take 25 mcg by mouth daily before breakfast.    . lisinopril (PRINIVIL,ZESTRIL) 10 MG tablet Take 10 mg by mouth daily.    . meloxicam (MOBIC) 15 MG tablet Take 1 tablet (15 mg total) by mouth daily. 30 tablet 0  . methylPREDNISolone (MEDROL DOSEPAK) 4 MG TBPK tablet Take as instructed 21 tablet 0  . pravastatin (PRAVACHOL) 20 MG tablet Take 20 mg by mouth daily.     Current Facility-Administered Medications on File Prior to Visit  Medication Dose Route Frequency Provider Last Rate Last Dose  . triamcinolone acetonide (KENALOG) 10 MG/ML injection 10 mg  10 mg Other Once Holbrook, Encarnacion Bole, DPM      . triamcinolone acetonide (KENALOG-40) injection 20 mg  20 mg Other Once Landis Martins, DPM        Allergies  Allergen Reactions  . Codeine     Objective:   General:  Alert and oriented x 3, in no acute distress  Dermatology: Skin is warm, dry, and supple bilateral. Nails are within normal limits. There is no lower extremity erythema, no eccymosis, no open lesions present bilateral. Right 3rd toe cyst unchanged.  Vascular: Dorsalis Pedis and Posterior Tibial pedal pulses are 2/4 bilateral. + hair growth noted bilateral. Capillary Fill Time is 3 seconds in all digits. No varicosities, No edema bilateral lower extremities.   Neurological: Sensation grossly intact to light touch with an achilles reflex of +2 and a negative  Tinel's sign bilateral. Vibratory, sharp/dull, Semmes Weinstein Monofilament within normal limits.   Musculoskeletal: There is  tenderness to palpation at the medial calcaneal tubercale and through the insertion of the plantar fascia on the Left foot. No pain with compression to calcaneus or application of tuning fork. There is decreased Ankle joint range of motion bilateral. All other joints range of motion  within normal limits bilateral. Strength 5/5 bilateral.   Assessment and Plan: Problem List Items Addressed This Visit    None    Visit Diagnoses    Plantar fasciitis of left foot    -  Primary   Relevant Medications   triamcinolone acetonide (KENALOG) 10 MG/ML injection 10 mg (Start on 11/01/2016  5:15 PM)   Left foot pain       Relevant Medications   triamcinolone acetonide (KENALOG) 10 MG/ML injection 10 mg (Start on 11/01/2016  5:15 PM)      -Complete examination performed.  -Previous x-rays reviewed. -Re-Discussed with patient in detail the condition of plantar fasciitis, how this  occurs related to the foot type of the patient and general treatment options. After oral consent and aseptic prep, injected a mixture containing 1 ml of 2%  plain lidocaine, 1 ml 0.5% plain marcaine, 0.5 ml of kenalog 10 and 0.5 ml of dexamethasone phosphate into Left heel without complication. This is injection #3 to area. Post-injection care discussed with patient.  -Applied plantar fascial strapping on left foot  -Continue with stretching, icing,  good supportive shoes, inserts daily.  -Discussed long term care and reocurrence; will closely monitor; if fails to improve will consider other treatment modalities.  -Patient to return to 3 weeks for follow up of left heel pain.  Landis Martins, DPM

## 2016-11-01 NOTE — Patient Instructions (Signed)

## 2016-11-22 ENCOUNTER — Ambulatory Visit: Payer: PPO | Admitting: Sports Medicine

## 2016-11-26 ENCOUNTER — Other Ambulatory Visit: Payer: Self-pay | Admitting: Nurse Practitioner

## 2016-11-26 DIAGNOSIS — Z1231 Encounter for screening mammogram for malignant neoplasm of breast: Secondary | ICD-10-CM

## 2016-11-30 ENCOUNTER — Ambulatory Visit (INDEPENDENT_AMBULATORY_CARE_PROVIDER_SITE_OTHER): Payer: PPO | Admitting: Sports Medicine

## 2016-11-30 DIAGNOSIS — M722 Plantar fascial fibromatosis: Secondary | ICD-10-CM | POA: Diagnosis not present

## 2016-11-30 DIAGNOSIS — M79672 Pain in left foot: Secondary | ICD-10-CM | POA: Diagnosis not present

## 2016-11-30 MED ORDER — MELOXICAM 15 MG PO TABS: 15.0000 mg | ORAL_TABLET | Freq: Every day | ORAL | 0 refills | Status: DC

## 2016-11-30 MED ORDER — METHYLPREDNISOLONE 4 MG PO TBPK: ORAL_TABLET | ORAL | 0 refills | Status: DC

## 2016-11-30 NOTE — Progress Notes (Signed)
Subjective: Joanna Price is a 66 y.o. female returns to office for follow up evaluation after Left heel injection for plantar fasciitis, injection #3 administered 4 weeks ago. Patient states that the injection seems to help her pain, now down to 6/10 was less with taping of which she kept in place for 7 days. Patient denies any recent changes in medications or new problems since last visit.   There are no active problems to display for this patient.   Current Outpatient Prescriptions on File Prior to Visit  Medication Sig Dispense Refill  . levothyroxine (SYNTHROID, LEVOTHROID) 25 MCG tablet Take 25 mcg by mouth daily before breakfast.    . lisinopril (PRINIVIL,ZESTRIL) 10 MG tablet Take 10 mg by mouth daily.    . pravastatin (PRAVACHOL) 20 MG tablet Take 20 mg by mouth daily.     Current Facility-Administered Medications on File Prior to Visit  Medication Dose Route Frequency Provider Last Rate Last Dose  . triamcinolone acetonide (KENALOG) 10 MG/ML injection 10 mg  10 mg Other Once Landis Martins, DPM      . triamcinolone acetonide (KENALOG) 10 MG/ML injection 10 mg  10 mg Other Once Luther, Abegail Kloeppel, DPM      . triamcinolone acetonide (KENALOG-40) injection 20 mg  20 mg Other Once Landis Martins, DPM        Allergies  Allergen Reactions  . Codeine     Objective:   General:  Alert and oriented x 3, in no acute distress  Dermatology: Skin is warm, dry, and supple bilateral. Nails are within normal limits. There is no lower extremity erythema, no eccymosis, no open lesions present bilateral. Right 3rd toe cyst unchanged.  Vascular: Dorsalis Pedis and Posterior Tibial pedal pulses are 2/4 bilateral. + hair growth noted bilateral. Capillary Fill Time is 3 seconds in all digits. No varicosities, No edema bilateral lower extremities.   Neurological: Sensation grossly intact to light touch with an achilles reflex of +2 and a negative Tinel's sign bilateral. Vibratory, sharp/dull,  Semmes Weinstein Monofilament within normal limits.   Musculoskeletal: There is  tenderness to palpation at the medial calcaneal tubercale and through the insertion of the plantar fascia on the Left foot. No pain with compression to calcaneus or application of tuning fork. There is decreased Ankle joint range of motion bilateral. All other joints range of motion  within normal limits bilateral. Strength 5/5 bilateral.   Assessment and Plan: Problem List Items Addressed This Visit    None    Visit Diagnoses    Plantar fasciitis of left foot    -  Primary   Relevant Medications   methylPREDNISolone (MEDROL DOSEPAK) 4 MG TBPK tablet   Left foot pain       Relevant Medications   meloxicam (MOBIC) 15 MG tablet     -Complete examination performed.  -Previous x-rays reviewed. -Re-Discussed with patient in detail the condition of plantar fasciitis, how this  occurs related to the foot type of the patient and general treatment options. -Refilled medrol and mobic to take as instructed  -Recommend custom functional foot orthotics  -Continue with stretching, icing, good supportive shoes daily.  -Discussed long term care and reocurrence; will closely monitor; if fails to improve will consider other treatment modalities.  -Patient to return to casting for orthotics with Rick.   Landis Martins, DPM

## 2016-12-05 ENCOUNTER — Ambulatory Visit
Admission: RE | Admit: 2016-12-05 | Discharge: 2016-12-05 | Disposition: A | Discharge status: Home / Self Care | Payer: PPO | Source: Ambulatory Visit | Attending: Nurse Practitioner | Admitting: Nurse Practitioner

## 2016-12-05 DIAGNOSIS — Z1231 Encounter for screening mammogram for malignant neoplasm of breast: Secondary | ICD-10-CM

## 2016-12-06 ENCOUNTER — Ambulatory Visit: Payer: PPO | Admitting: Orthotics

## 2016-12-06 ENCOUNTER — Encounter (INDEPENDENT_AMBULATORY_CARE_PROVIDER_SITE_OTHER): Payer: Self-pay

## 2016-12-06 DIAGNOSIS — M722 Plantar fascial fibromatosis: Secondary | ICD-10-CM | POA: Diagnosis not present

## 2016-12-06 NOTE — Progress Notes (Signed)
Patient presents today for CMFO casting upon the recommedation of Dr. Cannon Kettle.  Patient complains of pain primarily left heel due to dx of plantar fascitis.  This is compounded by her pes cavus, RF supinatus foot type.   Goal is arch support, rear foot stability.  Rich  To fab  [employer] was called and they stated L3020 IS a covered item w/ prior auth.   Prior auth being sent.

## 2016-12-13 DIAGNOSIS — Z1389 Encounter for screening for other disorder: Secondary | ICD-10-CM | POA: Diagnosis not present

## 2016-12-13 DIAGNOSIS — E785 Hyperlipidemia, unspecified: Secondary | ICD-10-CM | POA: Diagnosis not present

## 2016-12-13 DIAGNOSIS — Z6841 Body Mass Index (BMI) 40.0 and over, adult: Secondary | ICD-10-CM | POA: Diagnosis not present

## 2016-12-13 DIAGNOSIS — E669 Obesity, unspecified: Secondary | ICD-10-CM | POA: Diagnosis not present

## 2016-12-13 DIAGNOSIS — N959 Unspecified menopausal and perimenopausal disorder: Secondary | ICD-10-CM | POA: Diagnosis not present

## 2016-12-13 DIAGNOSIS — Z0000 Encounter for general adult medical examination without abnormal findings: Secondary | ICD-10-CM | POA: Diagnosis not present

## 2016-12-13 DIAGNOSIS — Z139 Encounter for screening, unspecified: Secondary | ICD-10-CM | POA: Diagnosis not present

## 2016-12-13 DIAGNOSIS — Z9181 History of falling: Secondary | ICD-10-CM | POA: Diagnosis not present

## 2016-12-13 DIAGNOSIS — Z136 Encounter for screening for cardiovascular disorders: Secondary | ICD-10-CM | POA: Diagnosis not present

## 2016-12-17 DIAGNOSIS — E785 Hyperlipidemia, unspecified: Secondary | ICD-10-CM | POA: Diagnosis not present

## 2016-12-17 DIAGNOSIS — I10 Essential (primary) hypertension: Secondary | ICD-10-CM | POA: Diagnosis not present

## 2016-12-17 DIAGNOSIS — E039 Hypothyroidism, unspecified: Secondary | ICD-10-CM | POA: Diagnosis not present

## 2016-12-17 DIAGNOSIS — Z6841 Body Mass Index (BMI) 40.0 and over, adult: Secondary | ICD-10-CM | POA: Diagnosis not present

## 2016-12-27 DIAGNOSIS — Z780 Asymptomatic menopausal state: Secondary | ICD-10-CM | POA: Diagnosis not present

## 2016-12-27 DIAGNOSIS — M8588 Other specified disorders of bone density and structure, other site: Secondary | ICD-10-CM | POA: Diagnosis not present

## 2016-12-27 DIAGNOSIS — M8580 Other specified disorders of bone density and structure, unspecified site: Secondary | ICD-10-CM | POA: Diagnosis not present

## 2017-01-10 ENCOUNTER — Ambulatory Visit: Payer: PPO | Admitting: Orthotics

## 2017-01-10 ENCOUNTER — Encounter (INDEPENDENT_AMBULATORY_CARE_PROVIDER_SITE_OTHER): Payer: Self-pay

## 2017-01-10 DIAGNOSIS — M722 Plantar fascial fibromatosis: Secondary | ICD-10-CM

## 2017-01-10 DIAGNOSIS — M79672 Pain in left foot: Secondary | ICD-10-CM

## 2017-01-10 NOTE — Progress Notes (Signed)
Patient came in today to pick up custom made foot orthotics.  The goals were accomplished and the patient reported no dissatisfaction with said orthotics.  Patient was advised of breakin period and how to report any issues. 

## 2017-02-07 ENCOUNTER — Telehealth: Payer: Self-pay | Admitting: *Deleted

## 2017-02-07 ENCOUNTER — Ambulatory Visit (INDEPENDENT_AMBULATORY_CARE_PROVIDER_SITE_OTHER): Payer: PPO | Admitting: Sports Medicine

## 2017-02-07 DIAGNOSIS — M79672 Pain in left foot: Secondary | ICD-10-CM

## 2017-02-07 DIAGNOSIS — M722 Plantar fascial fibromatosis: Secondary | ICD-10-CM

## 2017-02-07 MED ORDER — METHYLPREDNISOLONE 4 MG PO TBPK: ORAL_TABLET | ORAL | 0 refills | Status: DC

## 2017-02-07 MED ORDER — OXYCODONE-ACETAMINOPHEN 5-325 MG PO TABS: 1.0000 | ORAL_TABLET | Freq: Three times a day (TID) | ORAL | 0 refills | Status: DC | PRN

## 2017-02-07 MED ORDER — METHYLPREDNISOLONE 4 MG PO TBPK: ORAL_TABLET | ORAL | 0 refills | Status: AC

## 2017-02-07 NOTE — Progress Notes (Signed)
Subjective: Joanna Price is a 66 y.o. female returns to office for follow up evaluation of Left heel pain reports that pain is worse states that she feels as if the orthotics are not helping.  Patient states that she can barely put pressure on that walk on heel and that her heel is swollen and now at the ankle and pain that is radiating up her calf that has slowly been getting worse over the last 2 weeks patient reports that she has tried rest ice elevation with no improvement.  Patient denies any acute injury or trauma that could have caused her heel pain to worsen. Patient denies any recent changes in medications or other new problems since last visit.   There are no active problems to display for this patient.   Current Outpatient Prescriptions on File Prior to Visit  Medication Sig Dispense Refill  . levothyroxine (SYNTHROID, LEVOTHROID) 25 MCG tablet Take 25 mcg by mouth daily before breakfast.    . lisinopril (PRINIVIL,ZESTRIL) 10 MG tablet Take 10 mg by mouth daily.    . meloxicam (MOBIC) 15 MG tablet Take 1 tablet (15 mg total) by mouth daily. 30 tablet 0  . pravastatin (PRAVACHOL) 20 MG tablet Take 20 mg by mouth daily.     Current Facility-Administered Medications on File Prior to Visit  Medication Dose Route Frequency Provider Last Rate Last Dose  . triamcinolone acetonide (KENALOG) 10 MG/ML injection 10 mg  10 mg Other Once Landis Martins, DPM      . triamcinolone acetonide (KENALOG) 10 MG/ML injection 10 mg  10 mg Other Once [place], Dreux Mcgroarty, DPM      . triamcinolone acetonide (KENALOG-40) injection 20 mg  20 mg Other Once Landis Martins, DPM        Allergies  Allergen Reactions  . Codeine     Objective:   General:  Alert and oriented x 3, in no acute distress  Dermatology: Skin is warm, dry, and supple bilateral. Nails are within normal limits. There is no lower extremity erythema, no eccymosis, no open lesions present bilateral. Right 3rd toe cyst  unchanged.  Vascular: Dorsalis Pedis and Posterior Tibial pedal pulses are 2/4 bilateral. + hair growth noted bilateral. Capillary Fill Time is 3 seconds in all digits. No varicosities, No edema bilateral lower extremities.   Neurological: Sensation grossly intact to light touch with an achilles reflex of +2 and a negative Tinel's sign bilateral. Vibratory, sharp/dull, Semmes Weinstein Monofilament within normal limits.   Musculoskeletal: There is  tenderness to palpation at the medial calcaneal tubercale and through the insertion of the plantar fascia on the Left foot with focal swelling. No pain with compression to calcaneus or application of tuning fork. There is decreased Ankle joint range of motion bilateral. All other joints range of motion  within normal limits bilateral. Strength 5/5 bilateral.  No acute signs of DVT.  Assessment and Plan: Problem List Items Addressed This Visit    None    Visit Diagnoses    Plantar fasciitis of left foot    -  Primary   Relevant Medications   methylPREDNISolone (MEDROL DOSEPAK) 4 MG TBPK tablet   Left foot pain       Relevant Medications   oxyCODONE-acetaminophen (ROXICET) 5-325 MG tablet   Inflammatory heel pain, left         -Complete examination performed.  -Previous x-rays reviewed. -Re-Discussed with patient in detail the condition of plantar fasciitis with possible concern for compensation/tendon tear, how this occurs related  to the foot type of the patient and general treatment options. -Applied soft cast for patient to keep intact for 5 days and dispensed cam walker to use until her MRI is obtained -Refilled medrol to take as instructed  -Prescribe Percocet for extreme pain -We will send patient's orthotics back for further modification patient still has excessive rear foot supination with use of orthotics -Patient to return to office after MRI or sooner if problems or issues arise.  Landis Martins, DPM

## 2017-02-07 NOTE — Telephone Encounter (Signed)
-----   Message from [place], [place] sent at 02/07/2017  2:50 PM EDT ----- Regarding: MRI left foot MRI Left foot Worsening pain and swelling at heel at plantar fascia, r/o tear

## 2017-02-08 NOTE — Telephone Encounter (Signed)
Faxed required form, clinicals and demographics to [facility].

## 2017-02-08 NOTE — Telephone Encounter (Signed)
-----   Message from [place], [place] sent at 02/07/2017  2:50 PM EDT ----- Regarding: MRI left foot MRI Left foot Worsening pain and swelling at heel at plantar fascia, r/o tear

## 2017-02-12 ENCOUNTER — Other Ambulatory Visit: Payer: Self-pay | Admitting: Sports Medicine

## 2017-02-12 NOTE — Progress Notes (Signed)
"  Orthotics not working" Discussed patient's orthotics with Liliane Channel and brought them with me to [place] office. Rick to send orthotics back to [employer] for modifications: deepen heel cup/offload heel in center, add rearfoot valgus and forefoot valgus posting.  -Dr. Cannon Kettle

## 2017-03-14 ENCOUNTER — Other Ambulatory Visit: Payer: PPO | Admitting: *Deleted

## 2017-04-24 ENCOUNTER — Ambulatory Visit (INDEPENDENT_AMBULATORY_CARE_PROVIDER_SITE_OTHER): Payer: PPO | Admitting: Sports Medicine

## 2017-04-24 ENCOUNTER — Encounter: Payer: Self-pay | Admitting: Sports Medicine

## 2017-04-24 DIAGNOSIS — M79672 Pain in left foot: Secondary | ICD-10-CM

## 2017-04-24 DIAGNOSIS — Q667 Congenital pes cavus, unspecified foot: Secondary | ICD-10-CM

## 2017-04-24 DIAGNOSIS — M722 Plantar fascial fibromatosis: Secondary | ICD-10-CM

## 2017-04-24 MED ORDER — MELOXICAM 15 MG PO TABS: 15.0000 mg | ORAL_TABLET | Freq: Every day | ORAL | 0 refills | Status: AC

## 2017-04-24 NOTE — Patient Instructions (Signed)

## 2017-04-24 NOTE — Progress Notes (Signed)
Subjective: Joanna Price is a 67 y.o. female returns to office for follow up evaluation of Left>Right heel pain. Reports that pain is worse states that she feels as if the orthotics are not helping, pushing on her arch and pushing foot forward; only able to wear with 2 pairs of shoes and after few hours foot hurts across the top. Patient did not get MRI wasn't called to schedule. Patient denies any recent changes in medications or other new problems since last visit.   There are no active problems to display for this patient.   Current Outpatient Medications on File Prior to Visit  Medication Sig Dispense Refill  . levothyroxine (SYNTHROID, LEVOTHROID) 25 MCG tablet Take 25 mcg by mouth daily before breakfast.    . lisinopril (PRINIVIL,ZESTRIL) 10 MG tablet Take 10 mg by mouth daily.    . methylPREDNISolone (MEDROL DOSEPAK) 4 MG TBPK tablet Take as instructed 21 tablet 0  . oxyCODONE-acetaminophen (ROXICET) 5-325 MG tablet Take 1 tablet by mouth every 8 (eight) hours as needed for severe pain. 20 tablet 0  . pravastatin (PRAVACHOL) 20 MG tablet Take 20 mg by mouth daily.     Current Facility-Administered Medications on File Prior to Visit  Medication Dose Route Frequency Provider Last Rate Last Dose  . triamcinolone acetonide (KENALOG) 10 MG/ML injection 10 mg  10 mg Other Once Landis Martins, DPM      . triamcinolone acetonide (KENALOG) 10 MG/ML injection 10 mg  10 mg Other Once [place], Nelissa Bolduc, DPM      . triamcinolone acetonide (KENALOG-40) injection 20 mg  20 mg Other Once Landis Martins, DPM        Allergies  Allergen Reactions  . Codeine     Objective:   General:  Alert and oriented x 3, in no acute distress  Dermatology: Skin is warm, dry, and supple bilateral. Nails are within normal limits. There is no lower extremity erythema, no eccymosis, no open lesions present bilateral. Right 3rd toe cyst unchanged.  Vascular: Dorsalis Pedis and Posterior Tibial pedal pulses are  2/4 bilateral. + hair growth noted bilateral. Capillary Fill Time is 3 seconds in all digits. No varicosities, No edema bilateral lower extremities.   Neurological: Sensation grossly intact to light touch with an achilles reflex of +2 and a negative Tinel's sign bilateral. Vibratory, sharp/dull, Semmes Weinstein Monofilament within normal limits.   Musculoskeletal: There is  tenderness to palpation at the medial calcaneal tubercale and through the insertion of the plantar fascia and arch on the Left foot>Right. No pain with compression to calcaneus or application of tuning fork. There is decreased Ankle joint range of motion bilateral. All other joints range of motion  within normal limits bilateral. Strength 5/5 bilateral.  No acute signs of DVT.  Assessment and Plan: Problem List Items Addressed This Visit    None    Visit Diagnoses    Plantar fasciitis of left foot    -  Primary   Inflammatory heel pain, left       Plantar fasciitis       Left foot pain       Relevant Medications   meloxicam (MOBIC) 15 MG tablet   Pes cavus         -Complete examination performed.  -Previous x-rays reviewed. -Re-Discussed with patient in detail the condition of plantar fasciitis, how this occurs related to the cavus foot type of the patient and general treatment options. -Applied plantar fascial strapping bilateral -Dispensed heel padding  -Refilled  mobic -Patient to see Benjie Karvonen for orthotic adj vs re-casting and making a new set since her pain continues and we may need to start from scratch and make a new pair for her since they continue to be bothersome -Patient desires to hold off on MRI since new year and deductible hasn't been met -Patient to return to office to see [facility] or sooner if problems or issues arise.  Landis Martins, DPM

## 2017-05-13 DIAGNOSIS — H40033 Anatomical narrow angle, bilateral: Secondary | ICD-10-CM | POA: Diagnosis not present

## 2017-05-13 DIAGNOSIS — H2513 Age-related nuclear cataract, bilateral: Secondary | ICD-10-CM | POA: Diagnosis not present

## 2017-05-16 ENCOUNTER — Ambulatory Visit: Payer: PPO | Admitting: *Deleted

## 2017-05-16 DIAGNOSIS — M722 Plantar fascial fibromatosis: Secondary | ICD-10-CM

## 2017-06-17 DIAGNOSIS — E785 Hyperlipidemia, unspecified: Secondary | ICD-10-CM | POA: Diagnosis not present

## 2017-06-17 DIAGNOSIS — E039 Hypothyroidism, unspecified: Secondary | ICD-10-CM | POA: Diagnosis not present

## 2017-06-17 DIAGNOSIS — Z6841 Body Mass Index (BMI) 40.0 and over, adult: Secondary | ICD-10-CM | POA: Diagnosis not present

## 2017-06-17 DIAGNOSIS — Z2821 Immunization not carried out because of patient refusal: Secondary | ICD-10-CM | POA: Diagnosis not present

## 2017-06-17 DIAGNOSIS — I10 Essential (primary) hypertension: Secondary | ICD-10-CM | POA: Diagnosis not present

## 2017-06-17 DIAGNOSIS — Z1331 Encounter for screening for depression: Secondary | ICD-10-CM | POA: Diagnosis not present

## 2017-06-27 ENCOUNTER — Telehealth: Payer: Self-pay | Admitting: Sports Medicine

## 2017-06-27 NOTE — Telephone Encounter (Signed)
Dr. Stover please advise 

## 2017-06-27 NOTE — Telephone Encounter (Signed)
Recommend rest, ice, elevation, and compression with Ace or compression sleeve. Recommend over-the-counter anti-inflammatories/pain relievers like Motrin, Aleve, or Tylenol.  If pain continues to persist she will have to come into the office for reevaluation. -Dr. Chauncey Cruel

## 2017-06-27 NOTE — Telephone Encounter (Signed)
Pt called wanting to speak with Dr. Cannon Kettle about her foot having pain in her heel and swelling in her ankles. Wants Dr. Cannon Kettle to give her a call

## 2017-07-03 ENCOUNTER — Encounter: Payer: Self-pay | Admitting: Sports Medicine

## 2017-07-03 ENCOUNTER — Telehealth: Payer: Self-pay | Admitting: *Deleted

## 2017-07-03 ENCOUNTER — Ambulatory Visit (INDEPENDENT_AMBULATORY_CARE_PROVIDER_SITE_OTHER): Payer: PPO | Admitting: Sports Medicine

## 2017-07-03 DIAGNOSIS — M25472 Effusion, left ankle: Secondary | ICD-10-CM | POA: Diagnosis not present

## 2017-07-03 DIAGNOSIS — M722 Plantar fascial fibromatosis: Secondary | ICD-10-CM | POA: Diagnosis not present

## 2017-07-03 DIAGNOSIS — M79672 Pain in left foot: Secondary | ICD-10-CM

## 2017-07-03 DIAGNOSIS — M66872 Spontaneous rupture of other tendons, left ankle and foot: Secondary | ICD-10-CM | POA: Diagnosis not present

## 2017-07-03 MED ORDER — OXYCODONE-ACETAMINOPHEN 5-325 MG PO TABS: 1.0000 | ORAL_TABLET | Freq: Three times a day (TID) | ORAL | 0 refills | Status: AC | PRN

## 2017-07-03 NOTE — Telephone Encounter (Signed)
[facility] - Erica scheduled 07/06/2017 to arrive 8:30am for 8:45am imaging.

## 2017-07-03 NOTE — Telephone Encounter (Signed)
I spoke with [facility] - Joe and he states it was an issue with pt's insurance and it has been taken care of.

## 2017-07-03 NOTE — Progress Notes (Signed)
Subjective: Joanna Price is a 67 y.o. female returns to office for follow up evaluation of Left heel pain and swelling. Reports that pain is worse states that over the weekend had very intense pain causing her to limp and swelling states that pain radiates up the inside of her ankle and has worsened especially since she has gotten her orthotics over the last 2 weeks.  Patient denies any new injury or any other problems.  Patient states that she just wants her foot to get better.  There are no active problems to display for this patient.   Current Outpatient Medications on File Prior to Visit  Medication Sig Dispense Refill  . levothyroxine (SYNTHROID, LEVOTHROID) 25 MCG tablet Take 25 mcg by mouth daily before breakfast.    . lisinopril (PRINIVIL,ZESTRIL) 10 MG tablet Take 10 mg by mouth daily.    . meloxicam (MOBIC) 15 MG tablet Take 1 tablet (15 mg total) by mouth daily. 30 tablet 0  . methylPREDNISolone (MEDROL DOSEPAK) 4 MG TBPK tablet Take as instructed 21 tablet 0  . oxyCODONE-acetaminophen (ROXICET) 5-325 MG tablet Take 1 tablet by mouth every 8 (eight) hours as needed for severe pain. 20 tablet 0  . pravastatin (PRAVACHOL) 20 MG tablet Take 20 mg by mouth daily.     Current Facility-Administered Medications on File Prior to Visit  Medication Dose Route Frequency Provider Last Rate Last Dose  . triamcinolone acetonide (KENALOG) 10 MG/ML injection 10 mg  10 mg Other Once Landis Martins, DPM      . triamcinolone acetonide (KENALOG) 10 MG/ML injection 10 mg  10 mg Other Once [place], Avyay Coger, DPM      . triamcinolone acetonide (KENALOG-40) injection 20 mg  20 mg Other Once Landis Martins, DPM        Allergies  Allergen Reactions  . Codeine     Objective:   General:  Alert and oriented x 3, in no acute distress  Dermatology: Skin is warm, dry, and supple bilateral. Nails are within normal limits. There is no lower extremity erythema, no eccymosis, no open lesions present  bilateral. Right 3rd toe cyst unchanged.  Vascular: Dorsalis Pedis and Posterior Tibial pedal pulses are 2/4 bilateral. + hair growth noted bilateral. Capillary Fill Time is 3 seconds in all digits. No varicosities, trace edema at medial ankle on left.  Neurological: Sensation grossly intact to light touch with an achilles reflex of +2 and a negative Tinel's sign bilateral. Vibratory, sharp/dull, Semmes Weinstein Monofilament within normal limits.   Musculoskeletal: There is  tenderness to palpation at the medial calcaneal tubercale and through the insertion of the plantar fascia and arch on the Left foot, there is also tenderness along the posterior tibial tendon course even to the level of the muscle belly on the left. No pain with compression to calcaneus or application of tuning fork. There is decreased Ankle joint range of motion bilateral. All other joints range of motion  within normal limits bilateral. Strength 5/5 bilateral.  No acute signs of DVT.  Assessment and Plan: Problem List Items Addressed This Visit    None    Visit Diagnoses    Plantar fasciitis of left foot    -  Primary   Inflammatory heel pain, left       Left foot pain       Relevant Medications   oxyCODONE-acetaminophen (ROXICET) 5-325 MG tablet   Tibialis posterior tendon tear, nontraumatic, left       possible   Ankle swelling,  left          -Complete examination performed.  -Re-Discussed with patient in detail the condition of plantar fasciitis with now possible tendinitis versus partial tear since pain has been progressive and acutely worsening -Teacher, English as a foreign language boot to assist with edema and pain control to keep intact for the next 5-7 days -Prescribed a short-term course of Percocet to assist with more severe episodes of pain; recommend patient to return to using her cam boot over the next few days to help with pain control as well and to protect tendons until her MRI is done -Advised patient to continue with  rest, ice, elevation -Patient to refrain from wearing custom orthotic on the left -Ordered MRI for further evaluation to rule out tendon tear on right -Patient to return to office after MRI or sooner if problems or issues arise.  Landis Martins, DPM

## 2017-07-03 NOTE — Telephone Encounter (Signed)
This is Joanna Price, Immunologist from [facility] in [place]. Calling about the prescription for Roxicet written by Dr. Cannon Kettle. For some reason we are getting a message back that her DEA number is not active. If you could call us back to verify.  Our number is [phone]. Thank you.

## 2017-07-03 NOTE — Patient Instructions (Signed)
SOFT CAST/ UNNA BOOT INSTRUCTIONS  Unna boot needs to be worn for 5-7 days for maximum benefit.  If you next appointment is before 5-7 days, please remove prior to coming in.  It is important that you wear sturdy shoe or walking boot at all times when walking to protect cast.    **If at any time while wearing the unna boot you should notice any irritation such as a rash, redness, or itching, remove the boot and wash your foot/feet thoroughly.  BATHING INSTRUCTIONS  Keep soft cast clean and dry. If wet, pat dry and blow dry with hair dryer and call office for further instructions.

## 2017-07-03 NOTE — Telephone Encounter (Signed)
Left message on mobile phone informing pt of the 07/06/2017 MRi appt at [facility] [phone].

## 2017-07-03 NOTE — Telephone Encounter (Signed)
-----   Message from [place], [place] sent at 07/03/2017 10:21 AM EDT ----- Regarding: MRI left foot and ankle Eval for tendon tear at posterior tibial tendon and eval plantar fascia, hx of chronic foot/arch pain

## 2017-07-03 NOTE — Telephone Encounter (Signed)
Unable to leave message on home phone message states unable to complete call as dialed, and I attempted 3 times.

## 2017-07-10 DIAGNOSIS — M66872 Spontaneous rupture of other tendons, left ankle and foot: Secondary | ICD-10-CM | POA: Diagnosis not present

## 2017-07-10 DIAGNOSIS — M25572 Pain in left ankle and joints of left foot: Secondary | ICD-10-CM | POA: Diagnosis not present

## 2017-07-10 DIAGNOSIS — M722 Plantar fascial fibromatosis: Secondary | ICD-10-CM | POA: Diagnosis not present

## 2017-07-10 DIAGNOSIS — M79672 Pain in left foot: Secondary | ICD-10-CM | POA: Diagnosis not present

## 2017-07-11 ENCOUNTER — Encounter: Payer: Self-pay | Admitting: Sports Medicine

## 2017-07-31 ENCOUNTER — Encounter: Payer: Self-pay | Admitting: Sports Medicine

## 2017-07-31 ENCOUNTER — Ambulatory Visit (INDEPENDENT_AMBULATORY_CARE_PROVIDER_SITE_OTHER): Payer: PPO | Admitting: Sports Medicine

## 2017-07-31 DIAGNOSIS — M79672 Pain in left foot: Secondary | ICD-10-CM | POA: Diagnosis not present

## 2017-07-31 DIAGNOSIS — M722 Plantar fascial fibromatosis: Secondary | ICD-10-CM

## 2017-07-31 DIAGNOSIS — E039 Hypothyroidism, unspecified: Secondary | ICD-10-CM | POA: Diagnosis not present

## 2017-07-31 NOTE — Progress Notes (Signed)
Subjective: Joanna Price is a 67 y.o. female returns to office for follow up evaluation of Left heel pain and swelling. Reports that pain is about the same.  Reports that the Unna boot seem like it helps some however still has been using her cam boot due to the pain in her left heel.  Patient is also here for MRI results and discussion of further plan of care.  Patient denies nausea, vomiting, fever, chills, any other constitutional symptoms at this time.  There are no active problems to display for this patient.   Current Outpatient Medications on File Prior to Visit  Medication Sig Dispense Refill  . levothyroxine (SYNTHROID, LEVOTHROID) 25 MCG tablet Take 25 mcg by mouth daily before breakfast.    . lisinopril (PRINIVIL,ZESTRIL) 10 MG tablet Take 10 mg by mouth daily.    . meloxicam (MOBIC) 15 MG tablet Take 1 tablet (15 mg total) by mouth daily. 30 tablet 0  . methylPREDNISolone (MEDROL DOSEPAK) 4 MG TBPK tablet Take as instructed 21 tablet 0  . oxyCODONE-acetaminophen (ROXICET) 5-325 MG tablet Take 1 tablet by mouth every 8 (eight) hours as needed for severe pain. 15 tablet 0  . pravastatin (PRAVACHOL) 20 MG tablet Take 20 mg by mouth daily.     Current Facility-Administered Medications on File Prior to Visit  Medication Dose Route Frequency Provider Last Rate Last Dose  . triamcinolone acetonide (KENALOG) 10 MG/ML injection 10 mg  10 mg Other Once Landis Martins, DPM      . triamcinolone acetonide (KENALOG) 10 MG/ML injection 10 mg  10 mg Other Once [place], Perri Lamagna, DPM      . triamcinolone acetonide (KENALOG-40) injection 20 mg  20 mg Other Once Landis Martins, DPM        Allergies  Allergen Reactions  . Codeine     Objective:   General:  Alert and oriented x 3, in no acute distress  Dermatology: Skin is warm, dry, and supple bilateral. Nails are within normal limits. There is no lower extremity erythema, no eccymosis, no open lesions present bilateral. Right 3rd toe cyst  unchanged.  Vascular: Dorsalis Pedis and Posterior Tibial pedal pulses are 2/4 bilateral. + hair growth noted bilateral. Capillary Fill Time is 3 seconds in all digits. No varicosities, trace edema at medial ankle on left.  Neurological: Sensation grossly intact to light touch with an achilles reflex of +2 and a negative Tinel's sign bilateral. Vibratory, sharp/dull, Semmes Weinstein Monofilament within normal limits.   Musculoskeletal: There is  tenderness to palpation at the medial calcaneal tubercale and through the insertion of the plantar fascia and arch on the Left foot, there is also mild tenderness along the posterior tibial tendon course even to the level of the muscle belly on the left possible compensation strain. No pain with compression to calcaneus or application of tuning fork. There is decreased Ankle joint range of motion bilateral. All other joints range of motion  within normal limits bilateral. Strength 5/5 bilateral.  No acute signs of DVT.  MRI supportive of plantar fasciitis without rupture and a small synovial cyst at the TN joint supportive of osteoarthritis.  Assessment and Plan: Problem List Items Addressed This Visit    None    Visit Diagnoses    Plantar fasciitis of left foot    -  Primary   Inflammatory heel pain, left       Left foot pain         -Complete examination performed.  -MRI results  reviewed -Re-Discussed with patient in detail the condition of plantar fasciitis with compensation tendinitis -Patient does not want surgery at this time.  Patient elects for EPAT treatment in [place] will complementary do this treatment for patient in no chart since she has invested lots of money with orthotics that have failed to provide her any relief -Advised patient to refrain from ice and anti-inflammatories what she has started EPAT  Treatment -Dispensed night splint to use at bedtime (prior auth completed) and recommend continue with gentle stretching and for  episodes of pain may take Tylenol -Advised patient to continue with cam boot for extreme pain and when her heel is feeling better may transition to tennis shoe with heel lift -Advised patient if she gets no relief from this treatment the only option is surgical -Patient to return to office after EPAT or sooner if problems or issues arise.  Landis Martins, DPM

## 2017-08-08 ENCOUNTER — Ambulatory Visit: Payer: PPO

## 2017-08-08 ENCOUNTER — Other Ambulatory Visit: Payer: Self-pay

## 2017-08-08 DIAGNOSIS — M722 Plantar fascial fibromatosis: Secondary | ICD-10-CM

## 2017-08-22 ENCOUNTER — Ambulatory Visit: Payer: Self-pay

## 2017-08-22 DIAGNOSIS — M722 Plantar fascial fibromatosis: Secondary | ICD-10-CM

## 2017-08-27 NOTE — Progress Notes (Signed)
Patient presents with ongoing pain in her left heel. She has tried various treatments but is still having pain.   Moderate pain and tenderness when palpating plantar facia at medial heel area  ESWT administered to area for 4 joules and tolerated well. EPAT to surrounding tissues. She is to follow up in 1 week for 2nd treatment. Advised to avoid NSAIDs and ice during therapy and to rest and elevate during times of pain

## 2017-08-29 ENCOUNTER — Ambulatory Visit: Payer: PPO

## 2017-08-29 DIAGNOSIS — M722 Plantar fascial fibromatosis: Secondary | ICD-10-CM

## 2017-08-30 NOTE — Progress Notes (Signed)
Patient presents with ongoing pain in her left heel. She has tried various treatments but is still having pain. She says she has noticed a slight improvement in pain  Moderate pain and tenderness when palpating plantar facia at medial heel area  ESWT administered to area for 4 joules and tolerated well. EPAT to surrounding tissues. She is to follow up in 1 week for 3rd treatment. Advised to avoid NSAIDs and ice during therapy and to rest and elevate during times of pain

## 2017-09-10 NOTE — Progress Notes (Signed)
Patient presents with ongoing pain in her left heel. She has tried various treatments but is still having pain. She says she has noticed a slight improvement in pain  Moderate pain and tenderness when palpating plantar facia at medial heel area  ESWT administered to area for 4 joules and tolerated well. EPAT to surrounding tissues. She is to follow up in 4 weeks for 4th treatment. Advised to avoid NSAIDs and ice during therapy and to rest and elevate during times of pain

## 2017-09-26 ENCOUNTER — Ambulatory Visit: Payer: PPO

## 2017-09-26 DIAGNOSIS — M722 Plantar fascial fibromatosis: Secondary | ICD-10-CM

## 2017-09-27 NOTE — Progress Notes (Signed)
Patient presents with ongoing pain in her left heel. She has tried various treatments but is still having pain. She says she has noticed a slight improvement in pain, but when increasing activities pain returns  Moderate pain and tenderness when palpating plantar facia at medial heel area  ESWT administered to area for 12.6  joules and tolerated well. EPAT to surrounding tissues. Advised to avoid NSAIDs and ice during therapy and to rest and elevate during times of pain. Follow up with Dr Cannon Kettle in 4 weeks

## 2017-10-29 ENCOUNTER — Other Ambulatory Visit: Payer: Self-pay | Admitting: Nurse Practitioner

## 2017-10-29 DIAGNOSIS — Z1231 Encounter for screening mammogram for malignant neoplasm of breast: Secondary | ICD-10-CM

## 2017-11-12 ENCOUNTER — Ambulatory Visit: Payer: PPO | Admitting: Sports Medicine

## 2017-11-13 ENCOUNTER — Ambulatory Visit (INDEPENDENT_AMBULATORY_CARE_PROVIDER_SITE_OTHER): Payer: PPO | Admitting: Sports Medicine

## 2017-11-13 ENCOUNTER — Encounter: Payer: Self-pay | Admitting: Sports Medicine

## 2017-11-13 ENCOUNTER — Telehealth: Payer: Self-pay | Admitting: Sports Medicine

## 2017-11-13 DIAGNOSIS — M7732 Calcaneal spur, left foot: Secondary | ICD-10-CM

## 2017-11-13 DIAGNOSIS — M79672 Pain in left foot: Secondary | ICD-10-CM

## 2017-11-13 DIAGNOSIS — M722 Plantar fascial fibromatosis: Secondary | ICD-10-CM

## 2017-11-13 NOTE — Progress Notes (Signed)
Subjective: Joanna Price is a 67 y.o. female returns to office for follow up evaluation of Left heel pain and swelling. Reports that pain is about the same even after completing shockwave treatment.  Reports she has still been using her night splint, icing, and good supportive shoes with no additional relief. Patient denies nausea, vomiting, fever, chills, any other constitutional symptoms at this time.  There are no active problems to display for this patient.   Current Outpatient Medications on File Prior to Visit  Medication Sig Dispense Refill  . levothyroxine (SYNTHROID, LEVOTHROID) 25 MCG tablet Take 25 mcg by mouth daily before breakfast.    . levothyroxine (SYNTHROID, LEVOTHROID) 50 MCG tablet     . lisinopril (PRINIVIL,ZESTRIL) 10 MG tablet Take 10 mg by mouth daily.    . meloxicam (MOBIC) 15 MG tablet Take 1 tablet (15 mg total) by mouth daily. 30 tablet 0  . methylPREDNISolone (MEDROL DOSEPAK) 4 MG TBPK tablet Take as instructed 21 tablet 0  . oxyCODONE-acetaminophen (ROXICET) 5-325 MG tablet Take 1 tablet by mouth every 8 (eight) hours as needed for severe pain. 15 tablet 0  . pravastatin (PRAVACHOL) 20 MG tablet Take 20 mg by mouth daily.     Current Facility-Administered Medications on File Prior to Visit  Medication Dose Route Frequency Provider Last Rate Last Dose  . triamcinolone acetonide (KENALOG) 10 MG/ML injection 10 mg  10 mg Other Once Landis Martins, DPM      . triamcinolone acetonide (KENALOG) 10 MG/ML injection 10 mg  10 mg Other Once [place], Tyechia Allmendinger, DPM      . triamcinolone acetonide (KENALOG-40) injection 20 mg  20 mg Other Once Landis Martins, DPM        Allergies  Allergen Reactions  . Codeine    Family History  Problem Relation Age of Onset  . Cancer Mother        lung  . Stroke Father   . Hypertension Sister   . Thyroid disease Sister   . Hyperlipidemia Sister   . Stroke Paternal Grandfather   . Breast cancer Neg Hx    Past Surgical History:   Procedure Laterality Date  . CESAREAN SECTION     2 previous  . TUBAL LIGATION     Social History   Socioeconomic History  . Marital status: Married    Spouse name: Not on file  . Number of children: Not on file  . Years of education: Not on file  . Highest education level: Not on file  Occupational History  . Not on file  Social Needs  . Financial resource strain: Not on file  . Food insecurity:    Worry: Not on file    Inability: Not on file  . Transportation needs:    Medical: Not on file    Non-medical: Not on file  Tobacco Use  . Smoking status: Never Smoker  . Smokeless tobacco: Never Used  Substance and Sexual Activity  . Alcohol use: Yes    Comment: rarely  . Drug use: No  . Sexual activity: Yes    Birth control/protection: Surgical  Lifestyle  . Physical activity:    Days per week: Not on file    Minutes per session: Not on file  . Stress: Not on file  Relationships  . Social connections:    Talks on phone: Not on file    Gets together: Not on file    Attends religious service: Not on file    Active member of  club or organization: Not on file    Attends meetings of clubs or organizations: Not on file    Relationship status: Not on file  . Intimate partner violence:    Fear of current or ex partner: Not on file    Emotionally abused: Not on file    Physically abused: Not on file    Forced sexual activity: Not on file  Other Topics Concern  . Not on file  Social History Narrative  . Not on file   Objective:   General:  Alert and oriented x 3, in no acute distress  Dermatology: Skin is warm, dry, and supple bilateral. Nails are within normal limits. There is no lower extremity erythema, no eccymosis, no open lesions present bilateral. Right 3rd toe cyst unchanged.  Vascular: Dorsalis Pedis and Posterior Tibial pedal pulses are 2/4 bilateral. + hair growth noted bilateral. Capillary Fill Time is 3 seconds in all digits. No varicosities, trace edema  at medial ankle on left.  Neurological: Sensation grossly intact to light touch with an achilles reflex of +2 and a negative Tinel's sign bilateral. Vibratory, sharp/dull, Semmes Weinstein Monofilament within normal limits.   Musculoskeletal: There is  tenderness to palpation at the medial calcaneal tubercale and through the insertion of the plantar fascia and arch on the Left foot, there is resolved tenderness along the posterior tibial tendon course. No pain with compression to calcaneus or application of tuning fork. There is decreased Ankle joint range of motion bilateral. All other joints range of motion  within normal limits bilateral. Strength 5/5 bilateral.  No acute signs of DVT.  Assessment and Plan: Problem List Items Addressed This Visit    None    Visit Diagnoses    Plantar fasciitis of left foot    -  Primary   Heel spur, left       Inflammatory heel pain, left       Left foot pain         -Complete examination performed.  -Re-Discussed with patient in detail the condition of chronic plantar fasciitis that has failed EPAT  -Patient opt for surgical management. Consent obtained for Left open vs endoscopic plantar fascial release with removal of heel spur. Pre and Post op course explained. Risks, benefits, alternatives explained. No guarantees given or implied. Surgical booking slip submitted and provided patient with Surgical packet and info for [place] -To dispense wedge offloading heel shoe and to use crutches post op and Night splint -Patient to return to office after surgery  or sooner if problems or issues arise.  Landis Martins, DPM

## 2017-11-13 NOTE — Patient Instructions (Signed)
Pre-Operative Instructions  Congratulations, you have decided to take an important step towards improving your quality of life.  You can be assured that the doctors and staff at [facility] will be with you every step of the way.  Here are some important things you should know:  1. Plan to be at the surgery center/hospital at least 1 (one) hour prior to your scheduled time, unless otherwise directed by the surgical center/hospital staff.  You must have a responsible adult accompany you, remain during the surgery and drive you home.  Make sure you have directions to the surgical center/hospital to ensure you arrive on time. 2. If you are having surgery at [facility] or [facility], you will need a copy of your medical history and physical form from your family physician within one month prior to the date of surgery. We will give you a form for your primary physician to complete.  3. We make every effort to accommodate the date you request for surgery.  However, there are times where surgery dates or times have to be moved.  We will contact you as soon as possible if a change in schedule is required.   4. No aspirin/ibuprofen for one week before surgery.  If you are on aspirin, any non-steroidal anti-inflammatory medications (Mobic, Aleve, Ibuprofen) should not be taken seven (7) days prior to your surgery.  You make take Tylenol for pain prior to surgery.  5. Medications - If you are taking daily heart and blood pressure medications, seizure, reflux, allergy, asthma, anxiety, pain or diabetes medications, make sure you notify the surgery center/hospital before the day of surgery so they can tell you which medications you should take or avoid the day of surgery. 6. No food or drink after midnight the night before surgery unless directed otherwise by surgical center/hospital staff. 7. No alcoholic beverages 24-hours prior to surgery.  No smoking 24-hours prior or 24-hours after  surgery. 8. Wear loose pants or shorts. They should be loose enough to fit over bandages, boots, and casts. 9. Don't wear slip-on shoes. Sneakers are preferred. 10. Bring your boot with you to the surgery center/hospital.  Also bring crutches or a walker if your physician has prescribed it for you.  If you do not have this equipment, it will be provided for you after surgery. 11. If you have not been contacted by the surgery center/hospital by the day before your surgery, call to confirm the date and time of your surgery. 12. Leave-time from work may vary depending on the type of surgery you have.  Appropriate arrangements should be made prior to surgery with your employer. 13. Prescriptions will be provided immediately following surgery by your doctor.  Fill these as soon as possible after surgery and take the medication as directed. Pain medications will not be refilled on weekends and must be approved by the doctor. 14. Remove nail polish on the operative foot and avoid getting pedicures prior to surgery. 15. Wash the night before surgery.  The night before surgery wash the foot and leg well with water and the antibacterial soap provided. Be sure to pay special attention to beneath the toenails and in between the toes.  Wash for at least three (3) minutes. Rinse thoroughly with water and dry well with a towel.  Perform this wash unless told not to do so by your physician.  Enclosed: 1 Ice pack (please put in freezer the night before surgery)   1 Hibiclens skin cleaner     Pre-op instructions  If you have any questions regarding the instructions, please do not hesitate to call our office.  [place]: [address], [place], [place] [address] -- [phone]  [place]: [address] [place], [place] [address] -- [phone]  [place]: [address]  [place], [place] [address] -- [phone]  [place]: [address], [address], [place], [place] [address] -- [phone]  Website:  [email or web address] 

## 2017-11-13 NOTE — Telephone Encounter (Signed)
I am calling to schedule my foot surgery and I also wanted to have my insurance which is [employer] checked to see what they will pay. Please call me back at [phone]. Thank you.

## 2017-11-13 NOTE — Telephone Encounter (Signed)
Called pt and told her since we do not have her paperwork yet we cannot schedule her surgery. I told her once I had it that I would call her and get her surgery scheduled.

## 2017-11-22 ENCOUNTER — Telehealth: Payer: Self-pay | Admitting: *Deleted

## 2017-11-22 NOTE — Telephone Encounter (Signed)
"  I had called to schedule my surgery previously but they didn't have my paperwork yet.  So I am calling to see if they have it yet."  Yes, I have it.  Dr. Cannon Kettle does surgery on Mondays.  Do you have a date in mind?  "Yes, I'd like to do it on Monday."  She doesn't have anything on this coming Monday.  She can do it on August 26.  "Okay, that date will be fine."  You need to go online and register with the surgical center on [employer].  Someone from the surgical center will call you a day or two prior to your surgery date.  They will give you your arrival time.

## 2017-12-02 ENCOUNTER — Encounter: Payer: Self-pay | Admitting: Sports Medicine

## 2017-12-02 DIAGNOSIS — M7732 Calcaneal spur, left foot: Secondary | ICD-10-CM | POA: Diagnosis not present

## 2017-12-02 DIAGNOSIS — M722 Plantar fascial fibromatosis: Secondary | ICD-10-CM | POA: Diagnosis not present

## 2017-12-02 DIAGNOSIS — E7800 Pure hypercholesterolemia, unspecified: Secondary | ICD-10-CM | POA: Diagnosis not present

## 2017-12-03 ENCOUNTER — Telehealth: Payer: Self-pay | Admitting: Sports Medicine

## 2017-12-03 NOTE — Telephone Encounter (Signed)
I just had surgery yesterday with Dr. Cannon Kettle and she called me this morning but my phone had died. I was wondering, I already have a boot that y'all gave me before surgery and they gave me another boot to wear home. I brought my boot with me to wear home and I told them I had my boot with me and so I was wondering why they put another boot on there and will I have to pay for it. The number to reach me at is [phone]. Thank you. Bye.

## 2017-12-03 NOTE — Telephone Encounter (Signed)
Post op check phone call made. Patient did not answer. Left voicemail for patient to call office if she has any problems or concerns with her surgical foot. -Dr. Cannon Kettle

## 2017-12-06 ENCOUNTER — Ambulatory Visit: Payer: PPO

## 2017-12-12 ENCOUNTER — Ambulatory Visit (INDEPENDENT_AMBULATORY_CARE_PROVIDER_SITE_OTHER): Payer: PPO

## 2017-12-12 ENCOUNTER — Encounter: Payer: Self-pay | Admitting: Sports Medicine

## 2017-12-12 ENCOUNTER — Ambulatory Visit (INDEPENDENT_AMBULATORY_CARE_PROVIDER_SITE_OTHER): Payer: PPO | Admitting: Sports Medicine

## 2017-12-12 ENCOUNTER — Telehealth: Payer: Self-pay | Admitting: Sports Medicine

## 2017-12-12 VITALS — BP 143/81 | HR 65 | Temp 97.9°F | Resp 15

## 2017-12-12 DIAGNOSIS — Z9889 Other specified postprocedural states: Secondary | ICD-10-CM

## 2017-12-12 DIAGNOSIS — M722 Plantar fascial fibromatosis: Secondary | ICD-10-CM

## 2017-12-12 DIAGNOSIS — M79672 Pain in left foot: Secondary | ICD-10-CM

## 2017-12-12 DIAGNOSIS — M7732 Calcaneal spur, left foot: Secondary | ICD-10-CM | POA: Diagnosis not present

## 2017-12-12 NOTE — Telephone Encounter (Signed)
This is Chief Technology Officer about a prescription on Joanna Price. The prescription was written for percocet 10-325 written on 26 August. If you would give [place] a call back so we can clarify or make changes to the instructions. Our number here is [phone]. Thank you.

## 2017-12-12 NOTE — Progress Notes (Signed)
Subjective: Joanna Price is a 67 y.o. female patient seen today in office for POV #1 (DOS 12/02/2017), S/P left EPF with heel spur resection.  Patient denies pain at surgical site, denies calf pain, denies headache, chest pain, shortness of breath, nausea, vomiting, fever, or chills. Patient states that she is doing well with very little pain and swelling.  Patient reports that she has not needed her pain medication and is taking her gabapentin icing and elevating.  Patient denies any other issues at this time.  There are no active problems to display for this patient.   Current Outpatient Medications on File Prior to Visit  Medication Sig Dispense Refill  . levothyroxine (SYNTHROID, LEVOTHROID) 25 MCG tablet Take 25 mcg by mouth daily before breakfast.    . levothyroxine (SYNTHROID, LEVOTHROID) 50 MCG tablet     . lisinopril (PRINIVIL,ZESTRIL) 10 MG tablet Take 10 mg by mouth daily.    . meloxicam (MOBIC) 15 MG tablet Take 1 tablet (15 mg total) by mouth daily. 30 tablet 0  . methylPREDNISolone (MEDROL DOSEPAK) 4 MG TBPK tablet Take as instructed 21 tablet 0  . oxyCODONE-acetaminophen (ROXICET) 5-325 MG tablet Take 1 tablet by mouth every 8 (eight) hours as needed for severe pain. 15 tablet 0  . pravastatin (PRAVACHOL) 20 MG tablet Take 20 mg by mouth daily.     Current Facility-Administered Medications on File Prior to Visit  Medication Dose Route Frequency Provider Last Rate Last Dose  . triamcinolone acetonide (KENALOG) 10 MG/ML injection 10 mg  10 mg Other Once Landis Martins, DPM      . triamcinolone acetonide (KENALOG) 10 MG/ML injection 10 mg  10 mg Other Once [place], Prabhjot Piscitello, DPM      . triamcinolone acetonide (KENALOG-40) injection 20 mg  20 mg Other Once Landis Martins, DPM        Allergies  Allergen Reactions  . Codeine     Objective: There were no vitals filed for this visit.  General: No acute distress, AAOx3  Left foot: Sutures intact with no gapping or dehiscence  at surgical site, mild swelling to left heel, no erythema, no warmth, no drainage, no signs of infection noted, Capillary fill time <3 seconds in all digits, gross sensation present via light touch to left foot. No pain or crepitation with range of motion left foot.  No pain with calf compression.   Post Op Xray, left foot: No acute findings resected spur. Soft tissue swelling within normal limits for post op status.   Assessment and Plan:  Problem List Items Addressed This Visit    None    Visit Diagnoses    Post-operative state    -  Primary   Relevant Orders   DG Foot Complete Left   Plantar fasciitis of left foot       Relevant Orders   DG Foot Complete Left   Heel spur, left       Relevant Orders   DG Foot Complete Left   Inflammatory heel pain, left       Relevant Orders   DG Foot Complete Left   Left foot pain       Relevant Orders   DG Foot Complete Left       -Patient seen and evaluated -X-rays reviewed -Applied dry sterile dressing to surgical site left foot secured with ACE wrap and stockinet  -Advised patient to make sure to keep dressings clean, dry, and intact to left foot surgical site, removing the ACE as  needed  -Advised patient to continue with post-op shoe with heel offloading on left and cane -Continue with as needed meds -Advised patient to limit activity to necessity  -Advised patient to ice and elevate as necessary  -Will plan for possible suture removal at next office visit. In the meantime, patient to call office if any issues or problems arise.   Landis Martins, DPM

## 2017-12-12 NOTE — Telephone Encounter (Signed)
Yes I gave her that Rx on the day of surgery for Percocet 10/325mg  one tab by mouth every 6 hours disp 20 tabs -Dr Cannon Kettle

## 2017-12-12 NOTE — Telephone Encounter (Signed)
[facility] asked if this was an initial fill for the percocet or was she in the hospital taking. I told her it was an initial fill for surgery. Katrina states they will have to put a maximum dose of 3 tablets a day on the prescription and I told her that would be fine.

## 2017-12-16 DIAGNOSIS — E785 Hyperlipidemia, unspecified: Secondary | ICD-10-CM | POA: Diagnosis not present

## 2017-12-16 DIAGNOSIS — Z1331 Encounter for screening for depression: Secondary | ICD-10-CM | POA: Diagnosis not present

## 2017-12-16 DIAGNOSIS — Z9181 History of falling: Secondary | ICD-10-CM | POA: Diagnosis not present

## 2017-12-16 DIAGNOSIS — Z1339 Encounter for screening examination for other mental health and behavioral disorders: Secondary | ICD-10-CM | POA: Diagnosis not present

## 2017-12-16 DIAGNOSIS — Z136 Encounter for screening for cardiovascular disorders: Secondary | ICD-10-CM | POA: Diagnosis not present

## 2017-12-16 DIAGNOSIS — Z139 Encounter for screening, unspecified: Secondary | ICD-10-CM | POA: Diagnosis not present

## 2017-12-16 DIAGNOSIS — E669 Obesity, unspecified: Secondary | ICD-10-CM | POA: Diagnosis not present

## 2017-12-16 DIAGNOSIS — Z6836 Body mass index (BMI) 36.0-36.9, adult: Secondary | ICD-10-CM | POA: Diagnosis not present

## 2017-12-16 DIAGNOSIS — Z0000 Encounter for general adult medical examination without abnormal findings: Secondary | ICD-10-CM | POA: Diagnosis not present

## 2017-12-18 ENCOUNTER — Ambulatory Visit (INDEPENDENT_AMBULATORY_CARE_PROVIDER_SITE_OTHER): Payer: PPO | Admitting: Sports Medicine

## 2017-12-18 ENCOUNTER — Encounter: Payer: Self-pay | Admitting: Sports Medicine

## 2017-12-18 VITALS — BP 124/77 | HR 64 | Temp 96.7°F | Resp 15

## 2017-12-18 DIAGNOSIS — M79672 Pain in left foot: Secondary | ICD-10-CM

## 2017-12-18 DIAGNOSIS — M7732 Calcaneal spur, left foot: Secondary | ICD-10-CM

## 2017-12-18 DIAGNOSIS — Z9889 Other specified postprocedural states: Secondary | ICD-10-CM

## 2017-12-18 DIAGNOSIS — M722 Plantar fascial fibromatosis: Secondary | ICD-10-CM

## 2017-12-18 NOTE — Progress Notes (Signed)
Subjective: Joanna Price is a 67 y.o. female patient seen today in office for POV #2 (DOS 12/02/2017), S/P left EPF with heel spur resection.  Patient reports that she seems to be doing good only issues is pain to the ball of the foot and how she has to walk with her heel offloading shoe, denies calf pain, denies headache, chest pain, shortness of breath, nausea, vomiting, fever, or chills.  Patient reports that when she is having pain she is still been icing and taking Aleve as needed.  Patient denies any other issues at this time.  There are no active problems to display for this patient.   Current Outpatient Medications on File Prior to Visit  Medication Sig Dispense Refill  . levothyroxine (SYNTHROID, LEVOTHROID) 25 MCG tablet Take 25 mcg by mouth daily before breakfast.    . levothyroxine (SYNTHROID, LEVOTHROID) 50 MCG tablet     . lisinopril (PRINIVIL,ZESTRIL) 10 MG tablet Take 10 mg by mouth daily.    . meloxicam (MOBIC) 15 MG tablet Take 1 tablet (15 mg total) by mouth daily. 30 tablet 0  . methylPREDNISolone (MEDROL DOSEPAK) 4 MG TBPK tablet Take as instructed 21 tablet 0  . oxyCODONE-acetaminophen (ROXICET) 5-325 MG tablet Take 1 tablet by mouth every 8 (eight) hours as needed for severe pain. 15 tablet 0  . pravastatin (PRAVACHOL) 20 MG tablet Take 20 mg by mouth daily.     Current Facility-Administered Medications on File Prior to Visit  Medication Dose Route Frequency Provider Last Rate Last Dose  . triamcinolone acetonide (KENALOG) 10 MG/ML injection 10 mg  10 mg Other Once Landis Martins, DPM      . triamcinolone acetonide (KENALOG) 10 MG/ML injection 10 mg  10 mg Other Once [place], Graison Leinberger, DPM      . triamcinolone acetonide (KENALOG-40) injection 20 mg  20 mg Other Once Landis Martins, DPM        Allergies  Allergen Reactions  . Codeine     Objective: There were no vitals filed for this visit.  General: No acute distress, AAOx3  Left foot: Sutures intact with no  gapping or dehiscence at surgical site, mild swelling to left heel, no erythema, no warmth, no drainage, no signs of infection noted, Capillary fill time <3 seconds in all digits, gross sensation present via light touch to left foot. No pain or crepitation with range of motion left foot.  No pain with calf compression.   Assessment and Plan:  Problem List Items Addressed This Visit    None    Visit Diagnoses    Plantar fasciitis of left foot    -  Primary   Heel spur, left       Inflammatory heel pain, left       Left foot pain       Post-operative state           -Patient seen and evaluated -Sutures left in place but not yet ready to be removed -Applied dry sterile dressing to surgical site left foot secured with ACE wrap and stockinet  -Advised patient to make sure to keep dressings clean, dry, and intact to left foot surgical site, removing the ACE as needed  -Advised patient to continue with post-op shoe with heel offloading on left and cane -Continue with PRN meds -Advised patient to limit activity to necessity  -Advised patient to ice and elevate as necessary  -Will plan for suture removal at next office visit. In the meantime, patient to call  office if any issues or problems arise.   Landis Martins, DPM

## 2017-12-19 DIAGNOSIS — E039 Hypothyroidism, unspecified: Secondary | ICD-10-CM | POA: Diagnosis not present

## 2017-12-19 DIAGNOSIS — I10 Essential (primary) hypertension: Secondary | ICD-10-CM | POA: Diagnosis not present

## 2017-12-19 DIAGNOSIS — E785 Hyperlipidemia, unspecified: Secondary | ICD-10-CM | POA: Diagnosis not present

## 2017-12-19 DIAGNOSIS — Z6836 Body mass index (BMI) 36.0-36.9, adult: Secondary | ICD-10-CM | POA: Diagnosis not present

## 2017-12-19 DIAGNOSIS — Z2821 Immunization not carried out because of patient refusal: Secondary | ICD-10-CM | POA: Diagnosis not present

## 2017-12-25 ENCOUNTER — Encounter: Payer: Self-pay | Admitting: Sports Medicine

## 2017-12-25 ENCOUNTER — Ambulatory Visit (INDEPENDENT_AMBULATORY_CARE_PROVIDER_SITE_OTHER): Payer: PPO | Admitting: Sports Medicine

## 2017-12-25 VITALS — BP 111/69 | HR 69 | Temp 97.1°F | Resp 15

## 2017-12-25 DIAGNOSIS — M79672 Pain in left foot: Secondary | ICD-10-CM

## 2017-12-25 DIAGNOSIS — M722 Plantar fascial fibromatosis: Secondary | ICD-10-CM

## 2017-12-25 DIAGNOSIS — Z9889 Other specified postprocedural states: Secondary | ICD-10-CM

## 2017-12-25 DIAGNOSIS — M7732 Calcaneal spur, left foot: Secondary | ICD-10-CM

## 2017-12-25 NOTE — Progress Notes (Signed)
Subjective: Joanna Price is a 67 y.o. female patient seen today in office for POV #3 (DOS 12/02/2017), S/P left EPF with heel spur resection.  Patient reports that she has very little pain only when she flexes her foot sometimes or when she is on it too much she feels a little pain on average about 3 out of 10.  Patient is still ambulating with heel offloading shoe and denies any constitutional symptoms at this time.  There are no active problems to display for this patient.   Current Outpatient Medications on File Prior to Visit  Medication Sig Dispense Refill  . levothyroxine (SYNTHROID, LEVOTHROID) 25 MCG tablet Take 25 mcg by mouth daily before breakfast.    . levothyroxine (SYNTHROID, LEVOTHROID) 50 MCG tablet     . lisinopril (PRINIVIL,ZESTRIL) 10 MG tablet Take 10 mg by mouth daily.    . meloxicam (MOBIC) 15 MG tablet Take 1 tablet (15 mg total) by mouth daily. 30 tablet 0  . methylPREDNISolone (MEDROL DOSEPAK) 4 MG TBPK tablet Take as instructed 21 tablet 0  . oxyCODONE-acetaminophen (ROXICET) 5-325 MG tablet Take 1 tablet by mouth every 8 (eight) hours as needed for severe pain. 15 tablet 0  . pravastatin (PRAVACHOL) 20 MG tablet Take 20 mg by mouth daily.     Current Facility-Administered Medications on File Prior to Visit  Medication Dose Route Frequency Provider Last Rate Last Dose  . triamcinolone acetonide (KENALOG) 10 MG/ML injection 10 mg  10 mg Other Once Landis Martins, DPM      . triamcinolone acetonide (KENALOG) 10 MG/ML injection 10 mg  10 mg Other Once [place], Sahand Gosch, DPM      . triamcinolone acetonide (KENALOG-40) injection 20 mg  20 mg Other Once Landis Martins, DPM        Allergies  Allergen Reactions  . Codeine     Objective: There were no vitals filed for this visit.  General: No acute distress, AAOx3  Left foot: Sutures intact with no gapping or dehiscence at surgical site, mild swelling to left heel, no erythema, no warmth, no drainage, no signs of  infection noted, Capillary fill time <3 seconds in all digits, gross sensation present via light touch to left foot. No pain or crepitation with range of motion left foot.  No pain with calf compression.   Assessment and Plan:  Problem List Items Addressed This Visit    None    Visit Diagnoses    Post-operative state    -  Primary   Plantar fasciitis of left foot       Heel spur, left       Inflammatory heel pain, left       Left foot pain           -Patient seen and evaluated -Sutures removed -Applied dry sterile dressing to surgical site left foot and advised patient that on tomorrow she can remove dressings and shower as normal allowing Steri-Strips to fall off on their own -Advised patient to continue with post-op shoe with heel offloading on left and cane with slow transition over a period of 1 week to a normal tennis shoe as tolerated -Continue with gentle stretching and night splint -Continue with PRN meds -Advised patient to limit activity to tolerance -Advised patient to ice and elevate as necessary  -Will plan for incision check and increasing activities at next office visit. In the meantime, patient to call office if any issues or problems arise.   Landis Martins, DPM

## 2017-12-27 ENCOUNTER — Telehealth: Payer: Self-pay | Admitting: Sports Medicine

## 2017-12-27 NOTE — Telephone Encounter (Signed)
[name]>  Fri 12/27/2017 1:17 PM  Joanna Price

## 2017-12-27 NOTE — Telephone Encounter (Signed)
Pt had stitches taken out on Wednesday and it doesn't look like the area is closed all the way. Is that normal? Please call patient back

## 2017-12-27 NOTE — Telephone Encounter (Signed)
Yes it appears to be the dry skin separating or pulling off, This will happen with showering as the skin starts to shed. Apply antibiotic cream to the area and closely monitor -Dr. Cannon Kettle

## 2017-12-27 NOTE — Telephone Encounter (Signed)
Pt called and I gave the instructions as I left on the her phone. Pt states understanding.

## 2017-12-27 NOTE — Telephone Encounter (Signed)
Pt states she thinks the suture line looks like it is open at the top, she noticed this after a shower. I asked if she had any bleeding and she stated no. I asked if she could email pictures to my email and she states she will have her dtr help.

## 2017-12-27 NOTE — Telephone Encounter (Signed)
Left message informing pt the wound appeared to have dry edges superficial and healed beneath, when bathing get only shower wet, pat dry and cover with dry band aid, report any redness, drainage or increase pain to our office.

## 2017-12-30 NOTE — Telephone Encounter (Signed)
I spoke with pt she states it looks about the same, she has been using lotion to the dry skin. I told pt to use a light coating of antibiotic ointment and call with any changes or concerns.

## 2017-12-31 ENCOUNTER — Ambulatory Visit: Payer: PPO

## 2017-12-31 NOTE — Progress Notes (Signed)
DOS  12/02/2017  Left plantar fascial release open verses endoscopic with removal of heel spur.

## 2018-01-08 ENCOUNTER — Ambulatory Visit
Admission: RE | Admit: 2018-01-08 | Discharge: 2018-01-08 | Disposition: A | Discharge status: Home / Self Care | Payer: PPO | Source: Ambulatory Visit | Attending: Nurse Practitioner | Admitting: Nurse Practitioner

## 2018-01-08 DIAGNOSIS — Z1231 Encounter for screening mammogram for malignant neoplasm of breast: Secondary | ICD-10-CM

## 2018-01-09 ENCOUNTER — Ambulatory Visit (INDEPENDENT_AMBULATORY_CARE_PROVIDER_SITE_OTHER): Payer: PPO | Admitting: Sports Medicine

## 2018-01-09 ENCOUNTER — Encounter: Payer: Self-pay | Admitting: Sports Medicine

## 2018-01-09 DIAGNOSIS — M79672 Pain in left foot: Secondary | ICD-10-CM

## 2018-01-09 DIAGNOSIS — M7732 Calcaneal spur, left foot: Secondary | ICD-10-CM

## 2018-01-09 DIAGNOSIS — M722 Plantar fascial fibromatosis: Secondary | ICD-10-CM

## 2018-01-09 NOTE — Progress Notes (Signed)
Subjective: Joanna Price is a 67 y.o. female patient seen today in office for POV #4 (DOS 12/02/2017), S/P left EPF with heel spur resection.  Patient reports that she is doing pretty good can walk and stand about 6-1/2 hours before having any type of discomfort states that sometimes she gets a tingling or burning sensation in the inner side of her foot otherwise she is doing pretty well.  Patient denies any constitutional symptoms at this time.  There are no active problems to display for this patient.   Current Outpatient Medications on File Prior to Visit  Medication Sig Dispense Refill  . levothyroxine (SYNTHROID, LEVOTHROID) 25 MCG tablet Take 25 mcg by mouth daily before breakfast.    . levothyroxine (SYNTHROID, LEVOTHROID) 50 MCG tablet     . lisinopril (PRINIVIL,ZESTRIL) 10 MG tablet Take 10 mg by mouth daily.    . meloxicam (MOBIC) 15 MG tablet Take 1 tablet (15 mg total) by mouth daily. 30 tablet 0  . methylPREDNISolone (MEDROL DOSEPAK) 4 MG TBPK tablet Take as instructed 21 tablet 0  . oxyCODONE-acetaminophen (ROXICET) 5-325 MG tablet Take 1 tablet by mouth every 8 (eight) hours as needed for severe pain. 15 tablet 0  . pravastatin (PRAVACHOL) 20 MG tablet Take 20 mg by mouth daily.     Current Facility-Administered Medications on File Prior to Visit  Medication Dose Route Frequency Provider Last Rate Last Dose  . triamcinolone acetonide (KENALOG) 10 MG/ML injection 10 mg  10 mg Other Once Landis Martins, DPM      . triamcinolone acetonide (KENALOG) 10 MG/ML injection 10 mg  10 mg Other Once [place], Bosco Paparella, DPM      . triamcinolone acetonide (KENALOG-40) injection 20 mg  20 mg Other Once Landis Martins, DPM        Allergies  Allergen Reactions  . Codeine     Objective: There were no vitals filed for this visit.  General: No acute distress, AAOx3  Left foot: Incision site well-healed, mild swelling to left heel, no erythema, no warmth, no drainage, no signs of infection  noted, Capillary fill time <3 seconds in all digits, gross sensation present via light touch to left foot however subjective tingling and burning sensations at the medial foot. No pain or crepitation with range of motion left foot.  No pain with calf compression.   Assessment and Plan:  Problem List Items Addressed This Visit    None    Visit Diagnoses    Plantar fasciitis of left foot    -  Primary   Heel spur, left       Inflammatory heel pain, left       Left foot pain           -Patient seen and evaluated -Advised patient that tingling and burning could be normal after postoperative procedure course and to give time to get better -Continue with good supportive shoes -Continue with gentle stretching and night splint -Continue with PRN meds -Advised patient to limit activity to tolerance -Advised patient to ice and elevate as necessary  -Will plan for final postoperative check at next office visit. In the meantime, patient to call office if any issues or problems arise.   Landis Martins, DPM

## 2018-02-12 ENCOUNTER — Encounter: Payer: Self-pay | Admitting: Sports Medicine

## 2018-02-12 ENCOUNTER — Ambulatory Visit (INDEPENDENT_AMBULATORY_CARE_PROVIDER_SITE_OTHER): Payer: PPO | Admitting: Sports Medicine

## 2018-02-12 ENCOUNTER — Encounter: Payer: PPO | Admitting: Sports Medicine

## 2018-02-12 VITALS — BP 113/74 | HR 80 | Temp 98.9°F | Resp 16

## 2018-02-12 DIAGNOSIS — M722 Plantar fascial fibromatosis: Secondary | ICD-10-CM

## 2018-02-12 DIAGNOSIS — M79672 Pain in left foot: Secondary | ICD-10-CM

## 2018-02-12 DIAGNOSIS — M7732 Calcaneal spur, left foot: Secondary | ICD-10-CM

## 2018-02-12 MED ORDER — DICLOFENAC SODIUM 75 MG PO TBEC: 75.0000 mg | DELAYED_RELEASE_TABLET | Freq: Two times a day (BID) | ORAL | 0 refills | Status: DC

## 2018-02-12 NOTE — Progress Notes (Signed)
Subjective: Joanna Price is a 67 y.o. female patient seen today in office for POV # 5 (DOS 12/02/2017), S/P left EPF with heel spur resection.  Patient reports that she is doing pretty good however notices after about 5000 steps has pain across the top of the foot nothing at the surgical site that is 6 out of 10 dates that she has been taking Aleve her gabapentin using over-the-counter insoles and her night splint and stretching with still pain across the top of the surgical foot, denies warmth redness swelling or any other constitutional symptoms at this time.  There are no active problems to display for this patient.   Current Outpatient Medications on File Prior to Visit  Medication Sig Dispense Refill  . levothyroxine (SYNTHROID, LEVOTHROID) 25 MCG tablet Take 25 mcg by mouth daily before breakfast.    . lisinopril (PRINIVIL,ZESTRIL) 10 MG tablet Take 10 mg by mouth daily.    . meloxicam (MOBIC) 15 MG tablet Take 1 tablet (15 mg total) by mouth daily. 30 tablet 0  . methylPREDNISolone (MEDROL DOSEPAK) 4 MG TBPK tablet Take as instructed 21 tablet 0  . oxyCODONE-acetaminophen (ROXICET) 5-325 MG tablet Take 1 tablet by mouth every 8 (eight) hours as needed for severe pain. 15 tablet 0  . pravastatin (PRAVACHOL) 20 MG tablet Take 20 mg by mouth daily.     Current Facility-Administered Medications on File Prior to Visit  Medication Dose Route Frequency Provider Last Rate Last Dose  . triamcinolone acetonide (KENALOG) 10 MG/ML injection 10 mg  10 mg Other Once Landis Martins, DPM      . triamcinolone acetonide (KENALOG) 10 MG/ML injection 10 mg  10 mg Other Once [place], Shannan Garfinkel, DPM      . triamcinolone acetonide (KENALOG-40) injection 20 mg  20 mg Other Once Landis Martins, DPM        Allergies  Allergen Reactions  . Codeine     Objective: There were no vitals filed for this visit.  General: No acute distress, AAOx3  Left foot: Incision site well-healed, mild swelling to left heel,  no erythema, no warmth, no drainage, no signs of infection noted, Capillary fill time <3 seconds in all digits, gross sensation present via light touch to left foot however subjective tingling and burning sensations at the medial foot that is much improved however there is pain with palpation and dorsal midfoot that radiates to anterior ankle. No pain or crepitation with range of motion left foot.  No pain with calf compression.   Assessment and Plan:  Problem List Items Addressed This Visit    None    Visit Diagnoses    Plantar fasciitis of left foot    -  Primary   Heel spur, left       Inflammatory heel pain, left       Relevant Medications   diclofenac (VOLTAREN) 75 MG EC tablet   Left foot pain       Relevant Medications   diclofenac (VOLTAREN) 75 MG EC tablet       -Patient seen and evaluated -Advised patient that her symptoms could be coming from her body learning how to walk again after having surgery on her heel and that she could be dealing with some inflammatory pain versus tendinitis -Prescribed diclofenac to take in place of Aleve -Recommend topical pain cream as needed -Continue with good supportive shoes -Continue with gentle stretching and night splint -Continue with PRN meds -Advised patient to limit activity to tolerance -Advised patient  to ice and elevate as necessary  -Will plan for final postoperative to see if dorsal foot pain has gotten better check at next office visit. In the meantime, patient to call office if any issues or problems arise.   Landis Martins, DPM

## 2018-02-21 NOTE — Progress Notes (Signed)
Patient ID: Joanna Price, female   DOB: 09-29-1950, 67 y.o.   MRN: [id number]   Patient presents at Dr. Leeanne Rio request to have orthotics adjusted.  Orthotics will be sent back to manufacturer and patient will be called to schedule a fitting when the return.

## 2018-02-24 DIAGNOSIS — Z23 Encounter for immunization: Secondary | ICD-10-CM | POA: Diagnosis not present

## 2018-03-14 ENCOUNTER — Ambulatory Visit (INDEPENDENT_AMBULATORY_CARE_PROVIDER_SITE_OTHER): Payer: PPO | Admitting: Sports Medicine

## 2018-03-14 ENCOUNTER — Encounter: Payer: Self-pay | Admitting: Sports Medicine

## 2018-03-14 VITALS — BP 115/68 | HR 71 | Temp 97.0°F | Resp 16

## 2018-03-14 DIAGNOSIS — M79672 Pain in left foot: Secondary | ICD-10-CM

## 2018-03-14 DIAGNOSIS — M722 Plantar fascial fibromatosis: Secondary | ICD-10-CM

## 2018-03-14 DIAGNOSIS — M7732 Calcaneal spur, left foot: Secondary | ICD-10-CM

## 2018-03-14 MED ORDER — DICLOFENAC SODIUM 75 MG PO TBEC: 75.0000 mg | DELAYED_RELEASE_TABLET | Freq: Two times a day (BID) | ORAL | 0 refills | Status: DC

## 2018-03-14 NOTE — Patient Instructions (Signed)

## 2018-03-14 NOTE — Progress Notes (Signed)
Subjective: Joanna Price is a 67 y.o. female patient seen today in office for POV # 6 (DOS 12/02/2017), S/P left EPF with heel spur resection.  Patient reports that she is doing pretty good however notices after about 6000 steps has pain now in arch, no pain to top of foot anymore, denies any other constitutional symptoms at this time.  There are no active problems to display for this patient.   Current Outpatient Medications on File Prior to Visit  Medication Sig Dispense Refill  . levothyroxine (SYNTHROID, LEVOTHROID) 25 MCG tablet Take 25 mcg by mouth daily before breakfast.    . lisinopril (PRINIVIL,ZESTRIL) 10 MG tablet Take 10 mg by mouth daily.    . meloxicam (MOBIC) 15 MG tablet Take 1 tablet (15 mg total) by mouth daily. 30 tablet 0  . methylPREDNISolone (MEDROL DOSEPAK) 4 MG TBPK tablet Take as instructed 21 tablet 0  . oxyCODONE-acetaminophen (ROXICET) 5-325 MG tablet Take 1 tablet by mouth every 8 (eight) hours as needed for severe pain. 15 tablet 0  . pravastatin (PRAVACHOL) 20 MG tablet Take 20 mg by mouth daily.     Current Facility-Administered Medications on File Prior to Visit  Medication Dose Route Frequency Provider Last Rate Last Dose  . triamcinolone acetonide (KENALOG) 10 MG/ML injection 10 mg  10 mg Other Once Landis Martins, DPM      . triamcinolone acetonide (KENALOG) 10 MG/ML injection 10 mg  10 mg Other Once [place], Haadiya Frogge, DPM      . triamcinolone acetonide (KENALOG-40) injection 20 mg  20 mg Other Once Landis Martins, DPM        Allergies  Allergen Reactions  . Codeine     Objective: There were no vitals filed for this visit.  General: No acute distress, AAOx3  Left foot: Incision site well-healed, mild swelling to left heel, no erythema, no warmth, no drainage, no signs of infection noted, Capillary fill time <3 seconds in all digits, gross sensation present via light touch to left foot, pain to midarch, resolved pain to dorsal foot. No pain or  crepitation with range of motion left foot.  No pain with calf compression.   Assessment and Plan:  Problem List Items Addressed This Visit    None    Visit Diagnoses    Plantar fasciitis of left foot    -  Primary   Arch pain, left       Heel spur, left       Inflammatory heel pain, left       Relevant Medications   diclofenac (VOLTAREN) 75 MG EC tablet   Left foot pain       Relevant Medications   diclofenac (VOLTAREN) 75 MG EC tablet     -Patient seen and evaluated -Applied plantar fascial taping and advised patient to return to using her fascial brace after she has removed the tape for 2 weeks -Refilled Diclofenac  -Recommend topical pain cream as needed -Continue with good supportive shoes -Continue with gentle stretching and night splint -Advised patient to limit activity to tolerance -Advised patient to ice and elevate as necessary  -Return PRN or call office if pain is not better after 3 weeks. Landis Martins, DPM

## 2018-03-31 ENCOUNTER — Telehealth: Payer: Self-pay | Admitting: Sports Medicine

## 2018-03-31 DIAGNOSIS — M79672 Pain in left foot: Secondary | ICD-10-CM

## 2018-03-31 MED ORDER — DICLOFENAC SODIUM 75 MG PO TBEC: 75.0000 mg | DELAYED_RELEASE_TABLET | Freq: Two times a day (BID) | ORAL | 0 refills | Status: AC

## 2018-03-31 NOTE — Telephone Encounter (Signed)
Left message informing pt Dr. Cannon Kettle had refilled the diclofenac.

## 2018-03-31 NOTE — Telephone Encounter (Signed)
Patient needs a refill on [place]. Please call patient back

## 2018-03-31 NOTE — Addendum Note (Signed)
Addended by: Harriett Sine D on: 03/31/2018 11:09 AM   Modules accepted: Orders

## 2018-07-09 DIAGNOSIS — K529 Noninfective gastroenteritis and colitis, unspecified: Secondary | ICD-10-CM | POA: Diagnosis not present

## 2018-07-09 DIAGNOSIS — D638 Anemia in other chronic diseases classified elsewhere: Secondary | ICD-10-CM | POA: Diagnosis not present

## 2018-07-09 DIAGNOSIS — E782 Mixed hyperlipidemia: Secondary | ICD-10-CM | POA: Diagnosis not present

## 2018-07-09 DIAGNOSIS — N200 Calculus of kidney: Secondary | ICD-10-CM | POA: Diagnosis not present

## 2018-07-09 DIAGNOSIS — Z0000 Encounter for general adult medical examination without abnormal findings: Secondary | ICD-10-CM | POA: Diagnosis not present

## 2018-07-09 DIAGNOSIS — I10 Essential (primary) hypertension: Secondary | ICD-10-CM | POA: Diagnosis not present

## 2018-07-09 DIAGNOSIS — K5521 Angiodysplasia of colon with hemorrhage: Secondary | ICD-10-CM | POA: Diagnosis not present

## 2018-07-09 DIAGNOSIS — E785 Hyperlipidemia, unspecified: Secondary | ICD-10-CM | POA: Diagnosis not present

## 2018-07-09 DIAGNOSIS — E039 Hypothyroidism, unspecified: Secondary | ICD-10-CM | POA: Diagnosis not present

## 2018-07-09 DIAGNOSIS — F0390 Unspecified dementia without behavioral disturbance: Secondary | ICD-10-CM | POA: Diagnosis not present

## 2018-07-09 DIAGNOSIS — D472 Monoclonal gammopathy: Secondary | ICD-10-CM | POA: Diagnosis not present

## 2018-07-09 DIAGNOSIS — R251 Tremor, unspecified: Secondary | ICD-10-CM | POA: Diagnosis not present

## 2018-07-09 DIAGNOSIS — R7302 Impaired glucose tolerance (oral): Secondary | ICD-10-CM | POA: Diagnosis not present

## 2018-07-09 DIAGNOSIS — N184 Chronic kidney disease, stage 4 (severe): Secondary | ICD-10-CM | POA: Diagnosis not present

## 2018-07-09 DIAGNOSIS — E559 Vitamin D deficiency, unspecified: Secondary | ICD-10-CM | POA: Diagnosis not present

## 2018-07-10 DIAGNOSIS — I10 Essential (primary) hypertension: Secondary | ICD-10-CM | POA: Diagnosis not present

## 2018-07-10 DIAGNOSIS — E785 Hyperlipidemia, unspecified: Secondary | ICD-10-CM | POA: Diagnosis not present

## 2018-07-10 DIAGNOSIS — Z6836 Body mass index (BMI) 36.0-36.9, adult: Secondary | ICD-10-CM | POA: Diagnosis not present

## 2018-07-10 DIAGNOSIS — L299 Pruritus, unspecified: Secondary | ICD-10-CM | POA: Diagnosis not present

## 2018-07-10 DIAGNOSIS — E039 Hypothyroidism, unspecified: Secondary | ICD-10-CM | POA: Diagnosis not present

## 2018-08-15 ENCOUNTER — Other Ambulatory Visit: Payer: Self-pay

## 2018-08-15 ENCOUNTER — Ambulatory Visit: Payer: PPO | Admitting: Sports Medicine

## 2018-08-18 ENCOUNTER — Ambulatory Visit: Payer: PPO | Admitting: Podiatry

## 2018-08-20 ENCOUNTER — Ambulatory Visit (INDEPENDENT_AMBULATORY_CARE_PROVIDER_SITE_OTHER): Payer: PPO | Admitting: Sports Medicine

## 2018-08-20 ENCOUNTER — Other Ambulatory Visit: Payer: Self-pay

## 2018-08-20 ENCOUNTER — Encounter: Payer: Self-pay | Admitting: Sports Medicine

## 2018-08-20 VITALS — Temp 97.7°F | Resp 16

## 2018-08-20 DIAGNOSIS — Q667 Congenital pes cavus, unspecified foot: Secondary | ICD-10-CM

## 2018-08-20 DIAGNOSIS — M722 Plantar fascial fibromatosis: Secondary | ICD-10-CM

## 2018-08-20 DIAGNOSIS — M79672 Pain in left foot: Secondary | ICD-10-CM

## 2018-08-20 DIAGNOSIS — M792 Neuralgia and neuritis, unspecified: Secondary | ICD-10-CM

## 2018-08-20 MED ORDER — GABAPENTIN 100 MG PO CAPS: 100.0000 mg | ORAL_CAPSULE | Freq: Every day | ORAL | 3 refills | Status: DC

## 2018-08-20 NOTE — Progress Notes (Signed)
Subjective: Joanna Price is a 68 y.o. female patient seen today in office for POV # 7 (DOS 12/02/2017), S/P left EPF with heel spur resection.  Patient reports that she has no pain in the heel at the area of original surgery has pain in her arch and reports that when she tried to use her orthotic that was previously made the top of her foot would hurt because of the rubbing from the top of her shoe states that the pain is 8 out of 10 with also some numbness every now and again at night to her toes that she has been taking her husband's gabapentin which seems to help and taking Aleve and doing stretching also states that she got an over-the-counter insole that seems to give her some relief however she is interested today and a new set of custom orthotics.  Patient denies any other constitutional symptoms at this time.  There are no active problems to display for this patient.   Current Outpatient Medications on File Prior to Visit  Medication Sig Dispense Refill  . diclofenac (VOLTAREN) 75 MG EC tablet Take 1 tablet (75 mg total) by mouth 2 (two) times daily. 30 tablet 0  . levothyroxine (SYNTHROID) 50 MCG tablet Take 50 mcg by mouth daily.    Marland Kitchen levothyroxine (SYNTHROID, LEVOTHROID) 25 MCG tablet Take 25 mcg by mouth daily before breakfast.    . lisinopril (PRINIVIL,ZESTRIL) 10 MG tablet Take 10 mg by mouth daily.    . meloxicam (MOBIC) 15 MG tablet Take 1 tablet (15 mg total) by mouth daily. 30 tablet 0  . methylPREDNISolone (MEDROL DOSEPAK) 4 MG TBPK tablet Take as instructed 21 tablet 0  . oxyCODONE-acetaminophen (ROXICET) 5-325 MG tablet Take 1 tablet by mouth every 8 (eight) hours as needed for severe pain. 15 tablet 0  . pravastatin (PRAVACHOL) 20 MG tablet Take 20 mg by mouth daily.     Current Facility-Administered Medications on File Prior to Visit  Medication Dose Route Frequency Provider Last Rate Last Dose  . triamcinolone acetonide (KENALOG) 10 MG/ML injection 10 mg  10 mg Other Once  Landis Martins, DPM      . triamcinolone acetonide (KENALOG) 10 MG/ML injection 10 mg  10 mg Other Once [place], Derelle Cockrell, DPM      . triamcinolone acetonide (KENALOG-40) injection 20 mg  20 mg Other Once Landis Martins, DPM        Allergies  Allergen Reactions  . Codeine     Objective: There were no vitals filed for this visit.  General: No acute distress, AAOx3  Left foot: Incision site well-healed, no swelling redness warmth or drainage noted at previous procedure site at left heel there is no pain in the heel but there is pain at the mid arch with subjective numbness to toes 1 through 3 with mild pain with rubbing from shoe at the dorsal midfoot like previous with use of orthotics.  No pain or crepitation with range of motion left foot.  Pes cavus foot type.  No pain with calf compression.   Patient refused a new set of x-rays at this visit.  Assessment and Plan:  Problem List Items Addressed This Visit    None    Visit Diagnoses    Arch pain, left    -  Primary   Plantar fasciitis of left foot       Left foot pain       Inflammatory heel pain, left       Neuritis  Pes cavus         -Patient seen and evaluated -Applied plantar fascial taping was applied to the left foot with instructions on patient to keep clean dry and intact for 5 days -Patient was casted today for custom functional foot orthotics however we will have to check her insurance for approval before we attempt to bill for these orthotics -Prescribe gabapentin for patient to take bedtime starting out at 100 mg to a maximum dose of 300 mg at bedtime -Recommend topical pain cream as needed/Aspercreme pain patch once her plantar fascial taping is removed -Continue with good supportive shoes -Continue with gentle stretching and night splint as previous -Advised patient to limit activity to tolerance -Advised patient to ice and elevate as necessary  -Return pick up orthotics or sooner if problems or issues  arise.    Landis Martins, DPM

## 2018-09-26 DIAGNOSIS — Z20828 Contact with and (suspected) exposure to other viral communicable diseases: Secondary | ICD-10-CM | POA: Diagnosis not present

## 2018-10-13 DIAGNOSIS — H2513 Age-related nuclear cataract, bilateral: Secondary | ICD-10-CM | POA: Diagnosis not present

## 2018-10-13 DIAGNOSIS — H40033 Anatomical narrow angle, bilateral: Secondary | ICD-10-CM | POA: Diagnosis not present

## 2018-10-15 ENCOUNTER — Other Ambulatory Visit: Payer: Self-pay | Admitting: Sports Medicine

## 2018-10-15 ENCOUNTER — Other Ambulatory Visit: Payer: Self-pay

## 2018-10-15 ENCOUNTER — Ambulatory Visit (INDEPENDENT_AMBULATORY_CARE_PROVIDER_SITE_OTHER): Payer: PPO

## 2018-10-15 ENCOUNTER — Encounter: Payer: Self-pay | Admitting: Sports Medicine

## 2018-10-15 ENCOUNTER — Ambulatory Visit (INDEPENDENT_AMBULATORY_CARE_PROVIDER_SITE_OTHER): Payer: PPO | Admitting: Sports Medicine

## 2018-10-15 DIAGNOSIS — M79672 Pain in left foot: Secondary | ICD-10-CM

## 2018-10-15 DIAGNOSIS — M79671 Pain in right foot: Secondary | ICD-10-CM | POA: Diagnosis not present

## 2018-10-15 DIAGNOSIS — M25571 Pain in right ankle and joints of right foot: Secondary | ICD-10-CM

## 2018-10-15 DIAGNOSIS — M7751 Other enthesopathy of right foot: Secondary | ICD-10-CM

## 2018-10-15 DIAGNOSIS — M779 Enthesopathy, unspecified: Secondary | ICD-10-CM

## 2018-10-15 MED ORDER — TRIAMCINOLONE ACETONIDE 10 MG/ML IJ SUSP
10.0000 mg | Freq: Once | INTRAMUSCULAR | Status: AC
  Administered 2018-10-15: 21:00:00 10 mg

## 2018-10-15 NOTE — Progress Notes (Signed)
Subjective: Joanna Price is a 68 y.o. female patient who presents to office for evaluation of right  foot pain. Patient complains of progressive pain especially over the last few days since the weekend at the top of the right foot and ankle. Ranks pain 4-5/10 and is now interferring with daily activities with sharp pain and swelling that feels "hard" to the area. Patient has not tried any treatment. Patient denies any other pedal complaints. Denies injury/trip/fall/sprain/any causative factors.   There are no active problems to display for this patient.   Current Outpatient Medications on File Prior to Visit  Medication Sig Dispense Refill  . diclofenac (VOLTAREN) 75 MG EC tablet Take 1 tablet (75 mg total) by mouth 2 (two) times daily. 30 tablet 0  . gabapentin (NEURONTIN) 100 MG capsule Take 1 capsule (100 mg total) by mouth at bedtime. May slowly tritiate up to 300mg  max at bedtime 90 capsule 3  . levothyroxine (SYNTHROID) 50 MCG tablet Take 50 mcg by mouth daily.    Marland Kitchen levothyroxine (SYNTHROID, LEVOTHROID) 25 MCG tablet Take 25 mcg by mouth daily before breakfast.    . lisinopril (PRINIVIL,ZESTRIL) 10 MG tablet Take 10 mg by mouth daily.    . meloxicam (MOBIC) 15 MG tablet Take 1 tablet (15 mg total) by mouth daily. 30 tablet 0  . methylPREDNISolone (MEDROL DOSEPAK) 4 MG TBPK tablet Take as instructed 21 tablet 0  . oxyCODONE-acetaminophen (ROXICET) 5-325 MG tablet Take 1 tablet by mouth every 8 (eight) hours as needed for severe pain. 15 tablet 0  . pravastatin (PRAVACHOL) 20 MG tablet Take 20 mg by mouth daily.     Current Facility-Administered Medications on File Prior to Visit  Medication Dose Route Frequency Provider Last Rate Last Dose  . triamcinolone acetonide (KENALOG) 10 MG/ML injection 10 mg  10 mg Other Once Landis Martins, DPM      . triamcinolone acetonide (KENALOG) 10 MG/ML injection 10 mg  10 mg Other Once [place], Ellieana Dolecki, DPM      . triamcinolone acetonide (KENALOG-40)  injection 20 mg  20 mg Other Once Landis Martins, DPM        Allergies  Allergen Reactions  . Codeine     Objective:  General: Alert and oriented x3 in no acute distress  Dermatology: No open lesions bilateral lower extremities, no webspace macerations, no ecchymosis bilateral, all nails x 10 are well manicured. Old surgical scar well healed on left heel.  Vascular: Dorsalis Pedis and Posterior Tibial pedal pulses palpable, Capillary Fill Time 3 seconds,(+) pedal hair growth bilateral, no edema bilateral lower extremities, Temperature gradient within normal limits.  Neurology: Johney Maine sensation intact via light touch bilateral.   Musculoskeletal: Mild tenderness with palpation at medial right foot/ankle at proximal TN joint and over TA tendon course,No pain with calf compression bilateral. + Pes Cavus.   Gait: Mildly Antalgic gait  Xrays  Right Foot   Impression: TN arthritis, pes cavus, no other acute findings.   Assessment and Plan: Problem List Items Addressed This Visit    None    Visit Diagnoses    Tendonitis    -  Primary   Capsulitis of right ankle       Right ankle pain, unspecified chronicity       Right foot pain           -Complete examination performed -Xrays reviewed -Discussed treatement options for tendonitis vs capsulitis -After oral consent and aseptic prep, injected a mixture containing 1 ml of 2%  plain lidocaine, 1 ml 0.5% plain marcaine, 0.5 ml of kenalog 10 and 0.5 ml of dexamethasone phosphate into right TN joint without complication. Post-injection care discussed with patient.  -Recommend rest, ice, elevation, aspercreme -Recommend good supportive shoes. Patient is awaiting custom orthotics -Patient to return to office as needed or sooner if condition worsens.  Landis Martins, DPM

## 2018-10-16 ENCOUNTER — Other Ambulatory Visit: Payer: Self-pay | Admitting: Sports Medicine

## 2018-10-16 DIAGNOSIS — M779 Enthesopathy, unspecified: Secondary | ICD-10-CM

## 2018-10-16 DIAGNOSIS — M79672 Pain in left foot: Secondary | ICD-10-CM

## 2018-10-29 ENCOUNTER — Ambulatory Visit: Payer: PPO | Admitting: Sports Medicine

## 2018-11-07 ENCOUNTER — Ambulatory Visit (INDEPENDENT_AMBULATORY_CARE_PROVIDER_SITE_OTHER): Payer: PPO | Admitting: Sports Medicine

## 2018-11-07 ENCOUNTER — Encounter: Payer: Self-pay | Admitting: Sports Medicine

## 2018-11-07 ENCOUNTER — Other Ambulatory Visit: Payer: Self-pay

## 2018-11-07 VITALS — Temp 95.9°F | Resp 16

## 2018-11-07 DIAGNOSIS — M792 Neuralgia and neuritis, unspecified: Secondary | ICD-10-CM

## 2018-11-07 DIAGNOSIS — M779 Enthesopathy, unspecified: Secondary | ICD-10-CM | POA: Diagnosis not present

## 2018-11-07 DIAGNOSIS — M25571 Pain in right ankle and joints of right foot: Secondary | ICD-10-CM

## 2018-11-07 DIAGNOSIS — M7751 Other enthesopathy of right foot: Secondary | ICD-10-CM | POA: Diagnosis not present

## 2018-11-07 NOTE — Progress Notes (Signed)
Subjective: Joanna Price is a 68 y.o. female patient who presents to office for follow-up evaluation of right  foot pain.  Patient reports that pain is better it does not hurt just an annoying feeling worse at night of numbness to toes weakness with separation of toes on right over left as well as a sharp pain occasionally.  Patient has been consistent with using her Biofreeze and taking gabapentin.  Patient denies any known history but does admit to some occasional back problems.  Patient denies nausea vomiting fever chills or any other constitutional symptoms at this time.  There are no active problems to display for this patient.   Current Outpatient Medications on File Prior to Visit  Medication Sig Dispense Refill  . diclofenac (VOLTAREN) 75 MG EC tablet Take 1 tablet (75 mg total) by mouth 2 (two) times daily. 30 tablet 0  . gabapentin (NEURONTIN) 100 MG capsule Take 1 capsule (100 mg total) by mouth at bedtime. May slowly tritiate up to 300mg  max at bedtime 90 capsule 3  . levothyroxine (SYNTHROID) 50 MCG tablet Take 50 mcg by mouth daily.    Marland Kitchen levothyroxine (SYNTHROID, LEVOTHROID) 25 MCG tablet Take 25 mcg by mouth daily before breakfast.    . lisinopril (PRINIVIL,ZESTRIL) 10 MG tablet Take 10 mg by mouth daily.    . meloxicam (MOBIC) 15 MG tablet Take 1 tablet (15 mg total) by mouth daily. 30 tablet 0  . methylPREDNISolone (MEDROL DOSEPAK) 4 MG TBPK tablet Take as instructed 21 tablet 0  . oxyCODONE-acetaminophen (ROXICET) 5-325 MG tablet Take 1 tablet by mouth every 8 (eight) hours as needed for severe pain. 15 tablet 0  . pravastatin (PRAVACHOL) 20 MG tablet Take 20 mg by mouth daily.     Current Facility-Administered Medications on File Prior to Visit  Medication Dose Route Frequency Provider Last Rate Last Dose  . triamcinolone acetonide (KENALOG) 10 MG/ML injection 10 mg  10 mg Other Once Landis Martins, DPM      . triamcinolone acetonide (KENALOG) 10 MG/ML injection 10 mg  10  mg Other Once [place], Samani Deal, DPM      . triamcinolone acetonide (KENALOG-40) injection 20 mg  20 mg Other Once Landis Martins, DPM        Allergies  Allergen Reactions  . Codeine     Objective:  General: Alert and oriented x3 in no acute distress  Dermatology: No open lesions bilateral lower extremities, no webspace macerations, no ecchymosis bilateral, all nails x 10 are well manicured. Old surgical scar well healed on left heel.  Vascular: Dorsalis Pedis and Posterior Tibial pedal pulses palpable, Capillary Fill Time 3 seconds,(+) pedal hair growth bilateral, no edema bilateral lower extremities, Temperature gradient within normal limits.  Neurology: Johney Maine sensation intact via light touch bilateral.   Musculoskeletal: Minimal tenderness with palpation at medial right foot/ankle at proximal TN joint and over TA tendon course,No pain with calf compression bilateral. + Pes Cavus.   Assessment and Plan: Problem List Items Addressed This Visit    None    Visit Diagnoses    Neuritis    -  Primary   Tendonitis       Capsulitis of right ankle       Right ankle pain, unspecified chronicity           -Complete examination performed -Discussed with patient continued care for capsulitis and tendinitis with likely underlying neuritis she is complaining of sharp numbness and weakness -Ordered nerve conduction test for further evaluation  to see if there is any proximal nerve damage that could explain patient's symptoms -Meanwhile continue with gabapentin and topical pain cream as needed -Recommend good supportive shoes. Patient is awaiting custom orthotics like before -Patient to return to office after nerve test or sooner if condition worsens.  Landis Martins, DPM

## 2018-11-10 ENCOUNTER — Encounter: Payer: Self-pay | Admitting: Neurology

## 2018-11-10 ENCOUNTER — Telehealth: Payer: Self-pay | Admitting: *Deleted

## 2018-11-10 DIAGNOSIS — M792 Neuralgia and neuritis, unspecified: Secondary | ICD-10-CM

## 2018-11-10 NOTE — Telephone Encounter (Signed)
I called pt and asked if she would like to be scheduled in [place] since she has a pharmacy in [place] and sees Dr. Cannon Kettle in [place]. Pt states she would like to be seen wherever her insurance covers. I told her the group that is chosen will process her insurance for coverage. Pt states [facility] would be fine. I informed that we prefer [facility] [phone] and if they had not call prior to 11/12/2018 noon to call them. Pt states understanding. Faxed orders to [facility].

## 2018-11-10 NOTE — Telephone Encounter (Signed)
-----   Message from Landis Martins, [place] sent at 11/07/2018  1:03 PM EDT ----- Regarding: Nerve conduction test Neuritis

## 2018-11-18 ENCOUNTER — Other Ambulatory Visit: Payer: Self-pay

## 2018-11-18 ENCOUNTER — Ambulatory Visit (INDEPENDENT_AMBULATORY_CARE_PROVIDER_SITE_OTHER): Payer: PPO | Admitting: Neurology

## 2018-11-18 DIAGNOSIS — M792 Neuralgia and neuritis, unspecified: Secondary | ICD-10-CM

## 2018-11-18 NOTE — Procedures (Signed)
[facility]  [place], [place]  [place], [place] [address] Tel: [phone] Fax:  [phone] Test Date:  11/18/2018  Patient: Joanna Price DOB: 26-Dec-1950 Physician: Narda Amber, DO  Sex: Female Height: 5\' 2"  Ref Phys: Landis Martins, DPM  ID#: [id number] Temp: 36.0C Technician:    Patient Complaints: This is a 68 year old female referred for evaluation of intermittent bilateral feet numbness and weakness.  NCV & EMG Findings: Electrodiagnostic testing of the right lower extremity and additional studies of the left shows: 1. Bilateral sural and superficial peroneal sensory responses are within normal limits. 2. Bilateral peroneal and tibial motor responses are within normal limits. 3. Bilateral tibial H reflex studies are within normal limits. 4. There is no evidence of active or chronic motor axonal changes affecting any of the tested muscles.  Motor unit configuration and recruitment pattern is within normal limits.   Impression: This is a normal study of the lower extremities.  In particular, there is no evidence of a sensorimotor polyneuropathy or lumbosacral radiculopathy.      ___________________________ Narda Amber, DO    Nerve Conduction Studies Anti Sensory Summary Table   Site NR Peak (ms) Norm Peak (ms) P-T Amp (V) Norm P-T Amp  Left Sup Peroneal Anti Sensory (Ant Lat Mall)  36C  12 cm    2.3 <4.6 6.4 >3  Right Sup Peroneal Anti Sensory (Ant Lat Mall)  36C  12 cm    2.2 <4.6 7.5 >3  Left Sural Anti Sensory (Lat Mall)  36C  Calf    2.8 <4.6 8.7 >3  Right Sural Anti Sensory (Lat Mall)  36C  Calf    2.6 <4.6 9.8 >3   Motor Summary Table   Site NR Onset (ms) Norm Onset (ms) O-P Amp (mV) Norm O-P Amp Site1 Site2 Delta-0 (ms) Dist (cm) Vel (m/s) Norm Vel (m/s)  Left Peroneal Motor (Ext Dig Brev)  36C  Ankle    3.8 <6.0 4.1 >2.5 B Fib Ankle 7.5 37.0 49 >40  B Fib    11.3  4.0  Poplt B Fib 1.4 7.0 50 >40  Poplt    12.7  3.7          Right Peroneal Motor (Ext Dig Brev)  36C  Ankle    3.4 <6.0 3.1 >2.5 B Fib Ankle 6.8 35.0 51 >40  B Fib    10.2  2.9  Poplt B Fib 1.8 8.0 44 >40  Poplt    12.0  2.6         Left Tibial Motor (Abd Hall Brev)  36C  Ankle    2.9 <6.0 10.0 >4 Knee Ankle 8.5 40.0 47 >40  Knee    11.4  6.5         Right Tibial Motor (Abd Hall Brev)  36C  Ankle    2.8 <6.0 7.5 >4 Knee Ankle 7.8 37.0 47 >40  Knee    10.6  5.4          H Reflex Studies   NR H-Lat (ms) Lat Norm (ms) L-R H-Lat (ms)  Left Tibial (Gastroc)  36C     30.75 <35 0.00  Right Tibial (Gastroc)  36C     30.75 <35 0.00   EMG   Side Muscle Ins Act Fibs Psw Fasc Number Recrt Dur Dur. Amp Amp. Poly Poly. Comment  Right AntTibialis Nml Nml Nml Nml Nml Nml Nml Nml Nml Nml Nml Nml N/A  Right Gastroc Nml Nml Nml Nml  Nml Nml Nml Nml Nml Nml Nml Nml N/A  Right Flex Dig Long Nml Nml Nml Nml Nml Nml Nml Nml Nml Nml Nml Nml N/A  Right RectFemoris Nml Nml Nml Nml Nml Nml Nml Nml Nml Nml Nml Nml N/A  Right GluteusMed Nml Nml Nml Nml Nml Nml Nml Nml Nml Nml Nml Nml N/A  Left AntTibialis Nml Nml Nml Nml Nml Nml Nml Nml Nml Nml Nml Nml N/A  Left Gastroc Nml Nml Nml Nml Nml Nml Nml Nml Nml Nml Nml Nml N/A  Left Flex Dig Long Nml Nml Nml Nml Nml Nml Nml Nml Nml Nml Nml Nml N/A  Left RectFemoris Nml Nml Nml Nml Nml Nml Nml Nml Nml Nml Nml Nml N/A  Left GluteusMed Nml Nml Nml Nml Nml Nml Nml Nml Nml Nml Nml Nml N/A      Waveforms:

## 2018-11-21 ENCOUNTER — Telehealth: Payer: Self-pay | Admitting: *Deleted

## 2018-11-21 NOTE — Telephone Encounter (Signed)
FYI called patient and left message letting her know that essentially her nerve study is negative, no large nerve involvement. If symptoms are continuing to worsen to schedule an appointment -Dr. Cannon Kettle

## 2018-11-21 NOTE — Telephone Encounter (Signed)
Patient called to get results from nerve test.  Please advise.

## 2018-12-02 ENCOUNTER — Other Ambulatory Visit: Payer: Self-pay | Admitting: Nurse Practitioner

## 2018-12-02 DIAGNOSIS — Z1231 Encounter for screening mammogram for malignant neoplasm of breast: Secondary | ICD-10-CM

## 2018-12-05 DIAGNOSIS — G2581 Restless legs syndrome: Secondary | ICD-10-CM | POA: Diagnosis not present

## 2018-12-05 DIAGNOSIS — M7712 Lateral epicondylitis, left elbow: Secondary | ICD-10-CM | POA: Diagnosis not present

## 2018-12-05 DIAGNOSIS — Z6838 Body mass index (BMI) 38.0-38.9, adult: Secondary | ICD-10-CM | POA: Diagnosis not present

## 2018-12-25 DIAGNOSIS — Z6836 Body mass index (BMI) 36.0-36.9, adult: Secondary | ICD-10-CM | POA: Diagnosis not present

## 2018-12-25 DIAGNOSIS — Z139 Encounter for screening, unspecified: Secondary | ICD-10-CM | POA: Diagnosis not present

## 2018-12-25 DIAGNOSIS — Z1331 Encounter for screening for depression: Secondary | ICD-10-CM | POA: Diagnosis not present

## 2018-12-25 DIAGNOSIS — Z9181 History of falling: Secondary | ICD-10-CM | POA: Diagnosis not present

## 2018-12-25 DIAGNOSIS — E785 Hyperlipidemia, unspecified: Secondary | ICD-10-CM | POA: Diagnosis not present

## 2018-12-25 DIAGNOSIS — Z1231 Encounter for screening mammogram for malignant neoplasm of breast: Secondary | ICD-10-CM | POA: Diagnosis not present

## 2018-12-25 DIAGNOSIS — Z0000 Encounter for general adult medical examination without abnormal findings: Secondary | ICD-10-CM | POA: Diagnosis not present

## 2019-01-01 ENCOUNTER — Telehealth: Payer: Self-pay | Admitting: Sports Medicine

## 2019-01-01 NOTE — Telephone Encounter (Signed)
Pt called yesterday to [place] office asking if we had gotten the auth for a new pr of orthotics.  Melody told pt I would work on it and let her know. I faxed it yesterday and received auth.  I left message for pt that we received authorization from [employer] and that I would have Liliane Channel order the orthotics and someone would call when they come in.

## 2019-01-07 DIAGNOSIS — E039 Hypothyroidism, unspecified: Secondary | ICD-10-CM | POA: Diagnosis not present

## 2019-01-08 DIAGNOSIS — Z6839 Body mass index (BMI) 39.0-39.9, adult: Secondary | ICD-10-CM | POA: Diagnosis not present

## 2019-01-08 DIAGNOSIS — E039 Hypothyroidism, unspecified: Secondary | ICD-10-CM | POA: Diagnosis not present

## 2019-01-08 DIAGNOSIS — I10 Essential (primary) hypertension: Secondary | ICD-10-CM | POA: Diagnosis not present

## 2019-01-08 DIAGNOSIS — Z23 Encounter for immunization: Secondary | ICD-10-CM | POA: Diagnosis not present

## 2019-01-08 DIAGNOSIS — E785 Hyperlipidemia, unspecified: Secondary | ICD-10-CM | POA: Diagnosis not present

## 2019-01-08 DIAGNOSIS — M79602 Pain in left arm: Secondary | ICD-10-CM | POA: Diagnosis not present

## 2019-01-13 DIAGNOSIS — M7712 Lateral epicondylitis, left elbow: Secondary | ICD-10-CM | POA: Diagnosis not present

## 2019-01-16 ENCOUNTER — Ambulatory Visit
Admission: RE | Admit: 2019-01-16 | Discharge: 2019-01-16 | Disposition: A | Discharge status: Home / Self Care | Payer: PPO | Source: Ambulatory Visit | Attending: Nurse Practitioner | Admitting: Nurse Practitioner

## 2019-01-16 ENCOUNTER — Other Ambulatory Visit: Payer: Self-pay

## 2019-01-16 DIAGNOSIS — Z1231 Encounter for screening mammogram for malignant neoplasm of breast: Secondary | ICD-10-CM | POA: Diagnosis not present

## 2019-01-27 DIAGNOSIS — Z6838 Body mass index (BMI) 38.0-38.9, adult: Secondary | ICD-10-CM | POA: Diagnosis not present

## 2019-01-27 DIAGNOSIS — N950 Postmenopausal bleeding: Secondary | ICD-10-CM | POA: Diagnosis not present

## 2019-01-30 DIAGNOSIS — Z6839 Body mass index (BMI) 39.0-39.9, adult: Secondary | ICD-10-CM | POA: Diagnosis not present

## 2019-01-30 DIAGNOSIS — G2581 Restless legs syndrome: Secondary | ICD-10-CM | POA: Diagnosis not present

## 2019-01-30 DIAGNOSIS — E039 Hypothyroidism, unspecified: Secondary | ICD-10-CM | POA: Diagnosis not present

## 2019-02-06 DIAGNOSIS — Z780 Asymptomatic menopausal state: Secondary | ICD-10-CM | POA: Diagnosis not present

## 2019-02-09 DIAGNOSIS — N950 Postmenopausal bleeding: Secondary | ICD-10-CM | POA: Diagnosis not present

## 2019-02-18 ENCOUNTER — Ambulatory Visit (INDEPENDENT_AMBULATORY_CARE_PROVIDER_SITE_OTHER): Payer: PPO | Admitting: Sports Medicine

## 2019-02-18 ENCOUNTER — Other Ambulatory Visit: Payer: Self-pay

## 2019-02-18 DIAGNOSIS — M79674 Pain in right toe(s): Secondary | ICD-10-CM

## 2019-02-18 DIAGNOSIS — M779 Enthesopathy, unspecified: Secondary | ICD-10-CM

## 2019-02-18 DIAGNOSIS — Q667 Congenital pes cavus, unspecified foot: Secondary | ICD-10-CM | POA: Diagnosis not present

## 2019-02-18 DIAGNOSIS — M79672 Pain in left foot: Secondary | ICD-10-CM

## 2019-02-18 DIAGNOSIS — L600 Ingrowing nail: Secondary | ICD-10-CM | POA: Diagnosis not present

## 2019-02-18 DIAGNOSIS — M79675 Pain in left toe(s): Secondary | ICD-10-CM

## 2019-02-18 DIAGNOSIS — M792 Neuralgia and neuritis, unspecified: Secondary | ICD-10-CM

## 2019-02-18 NOTE — Progress Notes (Signed)
Subjective: Joanna Price is a 68 y.o. female patient who presents to office for follow-up evaluation of foot pain.  Patient reports that her right foot does not bother her but she is still having pain on her left at her arch as well as issues with her orthotics feels like her left arch still gets a lot of pressure and requires to take gabapentin and Aleve reports that pain is 5-7 out of 10 with still numbness that is a little better since being on gabapentin.  Patient also admits that there is some pain that radiates from her back down her lower legs as well as admits to issues with her first toenails reports that there is some pain sometimes along the corners her husband trim them last night with a little relief.  Denies redness warmth drainage swelling or any other acute symptoms at this time.  There are no active problems to display for this patient.   Current Outpatient Medications on File Prior to Visit  Medication Sig Dispense Refill  . diclofenac (VOLTAREN) 75 MG EC tablet Take 1 tablet (75 mg total) by mouth 2 (two) times daily. 30 tablet 0  . gabapentin (NEURONTIN) 100 MG capsule Take 1 capsule (100 mg total) by mouth at bedtime. May slowly tritiate up to 300mg  max at bedtime 90 capsule 3  . levothyroxine (SYNTHROID) 50 MCG tablet Take 50 mcg by mouth daily.    Marland Kitchen levothyroxine (SYNTHROID, LEVOTHROID) 25 MCG tablet Take 25 mcg by mouth daily before breakfast.    . lisinopril (PRINIVIL,ZESTRIL) 10 MG tablet Take 10 mg by mouth daily.    . meloxicam (MOBIC) 15 MG tablet Take 1 tablet (15 mg total) by mouth daily. 30 tablet 0  . methylPREDNISolone (MEDROL DOSEPAK) 4 MG TBPK tablet Take as instructed 21 tablet 0  . oxyCODONE-acetaminophen (ROXICET) 5-325 MG tablet Take 1 tablet by mouth every 8 (eight) hours as needed for severe pain. 15 tablet 0  . pravastatin (PRAVACHOL) 20 MG tablet Take 20 mg by mouth daily.     Current Facility-Administered Medications on File Prior to Visit   Medication Dose Route Frequency Provider Last Rate Last Dose  . triamcinolone acetonide (KENALOG) 10 MG/ML injection 10 mg  10 mg Other Once Landis Martins, DPM      . triamcinolone acetonide (KENALOG) 10 MG/ML injection 10 mg  10 mg Other Once [place], Jenisis Harmsen, DPM      . triamcinolone acetonide (KENALOG-40) injection 20 mg  20 mg Other Once Landis Martins, DPM        Allergies  Allergen Reactions  . Codeine     Objective:  General: Alert and oriented x3 in no acute distress  Dermatology: No open lesions bilateral lower extremities, no webspace macerations, no ecchymosis bilateral, bilateral hallux nails are mildly incurvated with no acute signs of infection.  Old surgical scar well healed on left heel.  Vascular: Dorsalis Pedis and Posterior Tibial pedal pulses palpable, Capillary Fill Time 3 seconds,(+) pedal hair growth bilateral, no edema bilateral lower extremities, Temperature gradient within normal limits.  Neurology: Gross sensation intact via light touch bilateral.  Subjective numbness and shooting pain posterior legs down to feet.  Musculoskeletal: No pain to palpation on right.  There is pain and tenderness at the medial arch on left,No pain with calf compression bilateral. + Pes Cavus.   Assessment and Plan: Problem List Items Addressed This Visit    None    Visit Diagnoses    Neuritis    -  Primary   Arch pain, left       Tendonitis       Pes cavus       Ingrowing nail       Toe pain, bilateral           -Complete examination performed -Discussed with patient treatment options -Patient was seen by Liliane Channel with recommendations to modify and add a scaphoid/arch pad on the left orthotic -Advised patient to return to office as scheduled for ingrown toenail procedure bilateral hallux meanwhile and no additional charge trimmed away ingrown toenail providing relief using a sterile nail nipper and advised patient to soak as needed with Epson salt -Gabapentin and Aleve as  needed for pain -Patient to return to office as scheduled for ingrown toenail procedure bilateral or sooner if condition worsens.  Landis Martins, DPM

## 2019-02-19 DIAGNOSIS — E039 Hypothyroidism, unspecified: Secondary | ICD-10-CM | POA: Diagnosis not present

## 2019-02-20 ENCOUNTER — Ambulatory Visit (INDEPENDENT_AMBULATORY_CARE_PROVIDER_SITE_OTHER): Payer: PPO | Admitting: Sports Medicine

## 2019-02-20 ENCOUNTER — Other Ambulatory Visit: Payer: Self-pay

## 2019-02-20 ENCOUNTER — Encounter: Payer: Self-pay | Admitting: Sports Medicine

## 2019-02-20 DIAGNOSIS — L600 Ingrowing nail: Secondary | ICD-10-CM

## 2019-02-20 DIAGNOSIS — M79674 Pain in right toe(s): Secondary | ICD-10-CM

## 2019-02-20 DIAGNOSIS — M79675 Pain in left toe(s): Secondary | ICD-10-CM

## 2019-02-20 MED ORDER — NEOMYCIN-POLYMYXIN-HC 3.5-10000-1 OT SOLN: OTIC | 0 refills | Status: AC

## 2019-02-20 NOTE — Progress Notes (Signed)
Subjective: Joanna Price is a 68 y.o. female patient presents to office today complaining of a moderately painful incurvated, bilateral hallux nail borders reports he trimmed the other day seems to help on the right not as sore as it was.  Patient has a chronic history of ingrown toenails and is here for procedure.  Patient denies fever/chills/nausea/vomitting/any other related constitutional symptoms at this time.  There are no active problems to display for this patient.   Current Outpatient Medications on File Prior to Visit  Medication Sig Dispense Refill  . diclofenac (VOLTAREN) 75 MG EC tablet Take 1 tablet (75 mg total) by mouth 2 (two) times daily. 30 tablet 0  . gabapentin (NEURONTIN) 100 MG capsule Take 1 capsule (100 mg total) by mouth at bedtime. May slowly tritiate up to 300mg  max at bedtime 90 capsule 3  . levothyroxine (SYNTHROID) 50 MCG tablet Take 50 mcg by mouth daily.    Marland Kitchen levothyroxine (SYNTHROID, LEVOTHROID) 25 MCG tablet Take 25 mcg by mouth daily before breakfast.    . lisinopril (PRINIVIL,ZESTRIL) 10 MG tablet Take 10 mg by mouth daily.    . meloxicam (MOBIC) 15 MG tablet Take 1 tablet (15 mg total) by mouth daily. 30 tablet 0  . methylPREDNISolone (MEDROL DOSEPAK) 4 MG TBPK tablet Take as instructed 21 tablet 0  . oxyCODONE-acetaminophen (ROXICET) 5-325 MG tablet Take 1 tablet by mouth every 8 (eight) hours as needed for severe pain. 15 tablet 0  . pravastatin (PRAVACHOL) 20 MG tablet Take 20 mg by mouth daily.     Current Facility-Administered Medications on File Prior to Visit  Medication Dose Route Frequency Provider Last Rate Last Dose  . triamcinolone acetonide (KENALOG) 10 MG/ML injection 10 mg  10 mg Other Once Landis Martins, DPM      . triamcinolone acetonide (KENALOG) 10 MG/ML injection 10 mg  10 mg Other Once [place], Kenlee Maler, DPM      . triamcinolone acetonide (KENALOG-40) injection 20 mg  20 mg Other Once Landis Martins, DPM        Allergies   Allergen Reactions  . Codeine     Objective:  There were no vitals filed for this visit.  General: Well developed, nourished, in no acute distress, alert and oriented x3   Dermatology: Skin is warm, dry and supple bilateral.  Right and left hallux nail appears to be severely incurvated with hyperkeratosis formation at the distal aspects of  the medial and lateral nail border. (-) Erythema. (+) Edema. (-) serosanguous  drainage present. The remaining nails appear unremarkable at this time. There are no open sores, lesions or other signs of infection  present.  Old surgical scar well-healed.  Vascular: Dorsalis Pedis artery and Posterior Tibial artery pedal pulses are 2/4 bilateral with immedate capillary fill time. Pedal hair growth present. No lower extremity edema.   Neruologic: Grossly intact via light touch bilateral.  Musculoskeletal: Tenderness to palpation of the bilateral hallux nail folds. Muscular strength within normal limits in all groups bilateral.  Pes cavus foot type.  Assesement and Plan: Problem List Items Addressed This Visit    None    Visit Diagnoses    Ingrowing nail    -  Primary   Toe pain, bilateral          -Discussed treatment alternatives and plan of care; Explained permanent/temporary nail avulsion and post procedure course to patient. Patient elects for PNA right and left hallux nail borders. - After a verbal and written consent, injected 3  ml of a 50:50 mixture of 2% plain  lidocaine and 0.5% plain marcaine in a normal hallux block fashion. Next, a  betadine prep was performed. Anesthesia was tested and found to be appropriate.  The offending right and left hallux nail borders were then incised from the hyponychium to the epinychium. The offending nail border was removed and cleared from the field. The area was curretted for any remaining nail or spicules. Phenol application performed and the area was then flushed with alcohol and dressed with  antibiotic cream and a dry sterile dressing. -Patient was instructed to leave the dressing intact for today and begin soaking  in a weak solution of betadine or Epsom salt and water tomorrow. Patient was instructed to soak for 15-20 minutes each day and apply neosporin/corticosporin and a gauze or bandaid dressing each day. -Patient was instructed to monitor the toe for signs of infection and return to office if toe becomes red, hot or swollen. -Advised ice, elevation, and tylenol or motrin if needed for pain.  -Patient is to return in 2-3 weeks for follow up care/nail check or sooner if problems arise.  Landis Martins, DPM

## 2019-02-20 NOTE — Patient Instructions (Signed)

## 2019-02-24 ENCOUNTER — Telehealth: Payer: Self-pay | Admitting: Sports Medicine

## 2019-02-24 NOTE — Telephone Encounter (Signed)
Received voicemail from Stanley @ [employer] regarding pts request for diabetic shoes. She asked if pt was diabetic and needed additional notes possibly..   I returned call and spoke to her and explained pt was not a diabetic but has several other foot conditions and we are trying to see if we could get it approved.Marland KitchenMarland KitchenMarland KitchenShe was forwarding it to the approval department.

## 2019-03-03 ENCOUNTER — Telehealth: Payer: Self-pay | Admitting: Sports Medicine

## 2019-03-03 NOTE — Telephone Encounter (Signed)
Per Dr Cannon Kettle she had the peer to peer call and the shoes are denied since pt is not diabetic. I also received a call from Sims @ [employer] [facility] stating it was being denied.   I have left a message for pt that it is still denied and to follow up with Dr Cannon Kettle and call me if any further questions.

## 2019-03-13 ENCOUNTER — Ambulatory Visit (INDEPENDENT_AMBULATORY_CARE_PROVIDER_SITE_OTHER): Payer: PPO | Admitting: Sports Medicine

## 2019-03-13 ENCOUNTER — Other Ambulatory Visit: Payer: Self-pay

## 2019-03-13 ENCOUNTER — Encounter: Payer: Self-pay | Admitting: Sports Medicine

## 2019-03-13 DIAGNOSIS — M79675 Pain in left toe(s): Secondary | ICD-10-CM

## 2019-03-13 DIAGNOSIS — M79674 Pain in right toe(s): Secondary | ICD-10-CM

## 2019-03-13 DIAGNOSIS — Z9889 Other specified postprocedural states: Secondary | ICD-10-CM

## 2019-03-13 DIAGNOSIS — L600 Ingrowing nail: Secondary | ICD-10-CM

## 2019-03-13 NOTE — Progress Notes (Signed)
Subjective: Joanna Price is a 68 y.o. female patient returns to office today for follow up evaluation after having Right and Left Hallux medial and lateral permanent nail avulsion performed on (02-20-19). Patient has been soaking using epsom salt and applying topical antibiotic covered with bandaid daily. Some soreness 2/10.  Patient denies fever/chills/nausea/vomitting/any other related constitutional symptoms at this time.  There are no active problems to display for this patient.   Current Outpatient Medications on File Prior to Visit  Medication Sig Dispense Refill  . diclofenac (VOLTAREN) 75 MG EC tablet Take 1 tablet (75 mg total) by mouth 2 (two) times daily. 30 tablet 0  . gabapentin (NEURONTIN) 100 MG capsule Take 1 capsule (100 mg total) by mouth at bedtime. May slowly tritiate up to 300mg  max at bedtime 90 capsule 3  . levothyroxine (SYNTHROID) 50 MCG tablet Take 50 mcg by mouth daily.    Marland Kitchen levothyroxine (SYNTHROID, LEVOTHROID) 25 MCG tablet Take 25 mcg by mouth daily before breakfast.    . lisinopril (PRINIVIL,ZESTRIL) 10 MG tablet Take 10 mg by mouth daily.    . meloxicam (MOBIC) 15 MG tablet Take 1 tablet (15 mg total) by mouth daily. 30 tablet 0  . methylPREDNISolone (MEDROL DOSEPAK) 4 MG TBPK tablet Take as instructed 21 tablet 0  . neomycin-polymyxin-hydrocortisone (CORTISPORIN) OTIC solution Apply 2-3 drops to the ingrown toenail site twice daily. Cover with band-aid. 10 mL 0  . oxyCODONE-acetaminophen (ROXICET) 5-325 MG tablet Take 1 tablet by mouth every 8 (eight) hours as needed for severe pain. 15 tablet 0  . pravastatin (PRAVACHOL) 20 MG tablet Take 20 mg by mouth daily.     Current Facility-Administered Medications on File Prior to Visit  Medication Dose Route Frequency Provider Last Rate Last Dose  . triamcinolone acetonide (KENALOG) 10 MG/ML injection 10 mg  10 mg Other Once Landis Martins, DPM      . triamcinolone acetonide (KENALOG) 10 MG/ML injection 10 mg  10  mg Other Once [place], Emireth Cockerham, DPM      . triamcinolone acetonide (KENALOG-40) injection 20 mg  20 mg Other Once Landis Martins, DPM        Allergies  Allergen Reactions  . Codeine     Objective:  General: Well developed, nourished, in no acute distress, alert and oriented x3   Dermatology: Skin is warm, dry and supple bilateral. Right and Left hallux medial and lateral nail bed appears to be clean, dry, with mild granular tissue and surrounding eschar/scab. (-) Erythema. (-) Edema. (-) serosanguous drainage present. The remaining nails appear unremarkable at this time. There are no other lesions or other signs of infection present.  Neurovascular status: Intact. No lower extremity swelling; No pain with calf compression bilateral.  Musculoskeletal: Decreased tenderness to palpation of the Bilateral hallux nail fold(s). Muscular strength within normal limits bilateral.   Assesement and Plan: Problem List Items Addressed This Visit    None    Visit Diagnoses    S/P nail surgery    -  Primary   Ingrowing nail       Toe pain, bilateral          -Examined patient  -Cleansed right/left hallux medial/lateral nail folds and gently scrubbed with peroxide and q-tip/curetted away eschar at site and applied antibiotic cream covered with bandaid.  -Discussed plan of care with patient. -Patient to now begin soaking in a weak solution of Epsom salt and warm water. Patient was instructed to soak for 15-20 minutes each day until  the toe appears normal and there is no drainage, redness, tenderness, or swelling at the procedure site, and apply neosporin and a gauze or bandaid dressing each day as needed. May leave open to air at night. -Educated patient on long term care after nail surgery. -Patient was instructed to monitor the toe for reoccurrence and signs of infection; Patient advised to return to office or go to ER if toe becomes red, hot or swollen. -Patient is waiting to get modified  orthotics; she had to have arch adjusted; Richie Lab sent them to old office location. -Patient is to return as scheduled for orthotics with Liliane Channel or sooner if problems arise.  Landis Martins, DPM

## 2019-03-14 ENCOUNTER — Other Ambulatory Visit: Payer: Self-pay | Admitting: Sports Medicine

## 2019-09-14 DIAGNOSIS — E785 Hyperlipidemia, unspecified: Secondary | ICD-10-CM | POA: Diagnosis not present

## 2019-09-14 DIAGNOSIS — R238 Other skin changes: Secondary | ICD-10-CM | POA: Diagnosis not present

## 2019-09-14 DIAGNOSIS — I10 Essential (primary) hypertension: Secondary | ICD-10-CM | POA: Diagnosis not present

## 2019-09-14 DIAGNOSIS — Z6841 Body Mass Index (BMI) 40.0 and over, adult: Secondary | ICD-10-CM | POA: Diagnosis not present

## 2019-09-14 DIAGNOSIS — G2581 Restless legs syndrome: Secondary | ICD-10-CM | POA: Diagnosis not present

## 2019-09-14 DIAGNOSIS — E039 Hypothyroidism, unspecified: Secondary | ICD-10-CM | POA: Diagnosis not present

## 2019-09-22 DIAGNOSIS — M79672 Pain in left foot: Secondary | ICD-10-CM | POA: Diagnosis not present

## 2019-09-22 DIAGNOSIS — Z9181 History of falling: Secondary | ICD-10-CM | POA: Diagnosis not present

## 2019-09-22 DIAGNOSIS — Z6839 Body mass index (BMI) 39.0-39.9, adult: Secondary | ICD-10-CM | POA: Diagnosis not present

## 2019-09-23 DIAGNOSIS — M79672 Pain in left foot: Secondary | ICD-10-CM | POA: Diagnosis not present

## 2019-09-23 DIAGNOSIS — Z6839 Body mass index (BMI) 39.0-39.9, adult: Secondary | ICD-10-CM | POA: Diagnosis not present

## 2019-12-10 DIAGNOSIS — G5602 Carpal tunnel syndrome, left upper limb: Secondary | ICD-10-CM | POA: Diagnosis not present

## 2019-12-10 DIAGNOSIS — Z6838 Body mass index (BMI) 38.0-38.9, adult: Secondary | ICD-10-CM | POA: Diagnosis not present

## 2019-12-10 DIAGNOSIS — M7712 Lateral epicondylitis, left elbow: Secondary | ICD-10-CM | POA: Diagnosis not present

## 2019-12-23 ENCOUNTER — Other Ambulatory Visit: Payer: Self-pay | Admitting: Nurse Practitioner

## 2019-12-23 DIAGNOSIS — Z1231 Encounter for screening mammogram for malignant neoplasm of breast: Secondary | ICD-10-CM

## 2020-01-19 ENCOUNTER — Other Ambulatory Visit: Payer: Self-pay

## 2020-01-19 ENCOUNTER — Ambulatory Visit
Admission: RE | Admit: 2020-01-19 | Discharge: 2020-01-19 | Disposition: A | Discharge status: Home / Self Care | Payer: PPO | Source: Ambulatory Visit | Attending: Nurse Practitioner | Admitting: Nurse Practitioner

## 2020-01-19 DIAGNOSIS — Z1231 Encounter for screening mammogram for malignant neoplasm of breast: Secondary | ICD-10-CM

## 2020-02-02 DIAGNOSIS — T63461A Toxic effect of venom of wasps, accidental (unintentional), initial encounter: Secondary | ICD-10-CM | POA: Diagnosis not present

## 2020-02-02 DIAGNOSIS — Z6837 Body mass index (BMI) 37.0-37.9, adult: Secondary | ICD-10-CM | POA: Diagnosis not present

## 2020-02-02 DIAGNOSIS — R252 Cramp and spasm: Secondary | ICD-10-CM | POA: Diagnosis not present

## 2020-03-11 DIAGNOSIS — Z6837 Body mass index (BMI) 37.0-37.9, adult: Secondary | ICD-10-CM | POA: Diagnosis not present

## 2020-03-11 DIAGNOSIS — H539 Unspecified visual disturbance: Secondary | ICD-10-CM | POA: Diagnosis not present

## 2020-03-11 DIAGNOSIS — R2689 Other abnormalities of gait and mobility: Secondary | ICD-10-CM | POA: Diagnosis not present

## 2020-03-15 ENCOUNTER — Encounter: Payer: Self-pay | Admitting: Neurology

## 2020-03-28 DIAGNOSIS — Z23 Encounter for immunization: Secondary | ICD-10-CM | POA: Diagnosis not present

## 2020-03-28 DIAGNOSIS — T23222A Burn of second degree of single left finger (nail) except thumb, initial encounter: Secondary | ICD-10-CM | POA: Diagnosis not present

## 2020-03-28 DIAGNOSIS — Z6837 Body mass index (BMI) 37.0-37.9, adult: Secondary | ICD-10-CM | POA: Diagnosis not present

## 2020-04-14 DIAGNOSIS — Z0000 Encounter for general adult medical examination without abnormal findings: Secondary | ICD-10-CM | POA: Diagnosis not present

## 2020-04-14 DIAGNOSIS — E785 Hyperlipidemia, unspecified: Secondary | ICD-10-CM | POA: Diagnosis not present

## 2020-04-14 DIAGNOSIS — Z139 Encounter for screening, unspecified: Secondary | ICD-10-CM | POA: Diagnosis not present

## 2020-04-14 DIAGNOSIS — E669 Obesity, unspecified: Secondary | ICD-10-CM | POA: Diagnosis not present

## 2020-04-14 DIAGNOSIS — Z1331 Encounter for screening for depression: Secondary | ICD-10-CM | POA: Diagnosis not present

## 2020-04-14 DIAGNOSIS — Z9181 History of falling: Secondary | ICD-10-CM | POA: Diagnosis not present

## 2020-04-20 DIAGNOSIS — H52203 Unspecified astigmatism, bilateral: Secondary | ICD-10-CM | POA: Diagnosis not present

## 2020-04-20 DIAGNOSIS — H5203 Hypermetropia, bilateral: Secondary | ICD-10-CM | POA: Diagnosis not present

## 2020-04-20 DIAGNOSIS — H25813 Combined forms of age-related cataract, bilateral: Secondary | ICD-10-CM | POA: Diagnosis not present

## 2020-04-20 DIAGNOSIS — H524 Presbyopia: Secondary | ICD-10-CM | POA: Diagnosis not present

## 2020-04-20 DIAGNOSIS — H538 Other visual disturbances: Secondary | ICD-10-CM | POA: Diagnosis not present
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
## 2020-05-23 ENCOUNTER — Ambulatory Visit (INDEPENDENT_AMBULATORY_CARE_PROVIDER_SITE_OTHER): Payer: PPO | Admitting: Neurology

## 2020-05-23 ENCOUNTER — Other Ambulatory Visit: Payer: Self-pay

## 2020-05-23 ENCOUNTER — Encounter: Payer: Self-pay | Admitting: Neurology

## 2020-05-23 VITALS — BP 117/80 | HR 68 | Ht 65.0 in | Wt 212.0 lb

## 2020-05-23 DIAGNOSIS — Z8489 Family history of other specified conditions: Secondary | ICD-10-CM | POA: Diagnosis not present

## 2020-05-23 DIAGNOSIS — G43109 Migraine with aura, not intractable, without status migrainosus: Secondary | ICD-10-CM

## 2020-05-23 DIAGNOSIS — R2681 Unsteadiness on feet: Secondary | ICD-10-CM | POA: Diagnosis not present

## 2020-05-23 DIAGNOSIS — H832X3 Labyrinthine dysfunction, bilateral: Secondary | ICD-10-CM | POA: Diagnosis not present

## 2020-05-23 NOTE — Patient Instructions (Addendum)
Will check MRI of brain with and without contrast. We have sent a referral to [facility] for your MRI and they will call you directly to schedule your appointment. They are located at [place]. If you need to contact them directly please call [phone].  If unremarkable would refer you to physical therapy to help with balance  He visual symptoms are ocular migraines

## 2020-05-24 DIAGNOSIS — S60512A Abrasion of left hand, initial encounter: Secondary | ICD-10-CM | POA: Diagnosis not present

## 2020-05-24 DIAGNOSIS — Z6838 Body mass index (BMI) 38.0-38.9, adult: Secondary | ICD-10-CM | POA: Diagnosis not present

## 2020-06-09 ENCOUNTER — Ambulatory Visit
Admission: RE | Admit: 2020-06-09 | Discharge: 2020-06-09 | Disposition: A | Discharge status: Home / Self Care | Payer: PPO | Source: Ambulatory Visit | Attending: Neurology | Admitting: Neurology

## 2020-06-09 ENCOUNTER — Other Ambulatory Visit: Payer: Self-pay

## 2020-06-09 DIAGNOSIS — R2681 Unsteadiness on feet: Secondary | ICD-10-CM

## 2020-06-09 DIAGNOSIS — R270 Ataxia, unspecified: Secondary | ICD-10-CM | POA: Diagnosis not present

## 2020-06-09 DIAGNOSIS — H832X3 Labyrinthine dysfunction, bilateral: Secondary | ICD-10-CM

## 2020-06-09 DIAGNOSIS — Z8489 Family history of other specified conditions: Secondary | ICD-10-CM

## 2020-06-09 DIAGNOSIS — I6782 Cerebral ischemia: Secondary | ICD-10-CM | POA: Diagnosis not present

## 2020-06-09 MED ORDER — GADOBENATE DIMEGLUMINE 529 MG/ML IV SOLN
20.0000 mL | Freq: Once | INTRAVENOUS | Status: AC | PRN
  Administered 2020-06-09: 20 mL via INTRAVENOUS

## 2020-06-10 NOTE — Progress Notes (Signed)
Pt advised of her MRI results. Pt to call back if she needs PT- Vestibular Rehab

## 2020-07-28 DIAGNOSIS — R600 Localized edema: Secondary | ICD-10-CM | POA: Diagnosis not present

## 2020-07-28 DIAGNOSIS — Z6839 Body mass index (BMI) 39.0-39.9, adult: Secondary | ICD-10-CM | POA: Diagnosis not present

## 2020-07-28 DIAGNOSIS — M7712 Lateral epicondylitis, left elbow: Secondary | ICD-10-CM | POA: Diagnosis not present

## 2020-08-22 DIAGNOSIS — Z6839 Body mass index (BMI) 39.0-39.9, adult: Secondary | ICD-10-CM | POA: Diagnosis not present

## 2020-08-22 DIAGNOSIS — M7062 Trochanteric bursitis, left hip: Secondary | ICD-10-CM | POA: Diagnosis not present

## 2020-09-07 DIAGNOSIS — Z6841 Body Mass Index (BMI) 40.0 and over, adult: Secondary | ICD-10-CM | POA: Diagnosis not present

## 2020-09-07 DIAGNOSIS — M5432 Sciatica, left side: Secondary | ICD-10-CM | POA: Diagnosis not present

## 2020-10-18 DIAGNOSIS — M5416 Radiculopathy, lumbar region: Secondary | ICD-10-CM | POA: Diagnosis not present

## 2020-10-18 DIAGNOSIS — M7062 Trochanteric bursitis, left hip: Secondary | ICD-10-CM | POA: Diagnosis not present

## 2020-10-27 DIAGNOSIS — M545 Low back pain, unspecified: Secondary | ICD-10-CM | POA: Diagnosis not present

## 2020-10-27 DIAGNOSIS — M5416 Radiculopathy, lumbar region: Secondary | ICD-10-CM | POA: Diagnosis not present

## 2020-11-03 DIAGNOSIS — M5416 Radiculopathy, lumbar region: Secondary | ICD-10-CM | POA: Diagnosis not present

## 2020-11-03 DIAGNOSIS — Z6841 Body Mass Index (BMI) 40.0 and over, adult: Secondary | ICD-10-CM | POA: Diagnosis not present

## 2020-11-03 DIAGNOSIS — Q799 Congenital malformation of musculoskeletal system, unspecified: Secondary | ICD-10-CM | POA: Diagnosis not present

## 2020-11-30 DIAGNOSIS — M47817 Spondylosis without myelopathy or radiculopathy, lumbosacral region: Secondary | ICD-10-CM | POA: Diagnosis not present

## 2020-11-30 DIAGNOSIS — M5416 Radiculopathy, lumbar region: Secondary | ICD-10-CM | POA: Diagnosis not present

## 2020-12-13 DIAGNOSIS — H538 Other visual disturbances: Secondary | ICD-10-CM | POA: Diagnosis not present

## 2020-12-13 DIAGNOSIS — H25813 Combined forms of age-related cataract, bilateral: Secondary | ICD-10-CM | POA: Diagnosis not present

## 2020-12-21 DIAGNOSIS — D692 Other nonthrombocytopenic purpura: Secondary | ICD-10-CM | POA: Diagnosis not present

## 2020-12-21 DIAGNOSIS — R233 Spontaneous ecchymoses: Secondary | ICD-10-CM | POA: Diagnosis not present

## 2020-12-21 DIAGNOSIS — Z6841 Body Mass Index (BMI) 40.0 and over, adult: Secondary | ICD-10-CM | POA: Diagnosis not present

## 2020-12-27 DIAGNOSIS — M5416 Radiculopathy, lumbar region: Secondary | ICD-10-CM | POA: Diagnosis not present

## 2021-02-07 DIAGNOSIS — M5416 Radiculopathy, lumbar region: Secondary | ICD-10-CM | POA: Diagnosis not present

## 2021-03-30 DIAGNOSIS — S76019A Strain of muscle, fascia and tendon of unspecified hip, initial encounter: Secondary | ICD-10-CM | POA: Diagnosis not present

## 2021-03-30 DIAGNOSIS — Z6841 Body Mass Index (BMI) 40.0 and over, adult: Secondary | ICD-10-CM | POA: Diagnosis not present

## 2021-04-13 ENCOUNTER — Other Ambulatory Visit: Payer: Self-pay | Admitting: Nurse Practitioner

## 2021-04-13 DIAGNOSIS — Z1231 Encounter for screening mammogram for malignant neoplasm of breast: Secondary | ICD-10-CM

## 2021-05-04 ENCOUNTER — Ambulatory Visit: Payer: PPO

## 2021-05-23 ENCOUNTER — Ambulatory Visit
Admission: RE | Admit: 2021-05-23 | Discharge: 2021-05-23 | Disposition: A | Discharge status: Home / Self Care | Payer: PPO | Source: Ambulatory Visit | Attending: Nurse Practitioner | Admitting: Nurse Practitioner

## 2021-05-23 DIAGNOSIS — Z1231 Encounter for screening mammogram for malignant neoplasm of breast: Secondary | ICD-10-CM

## 2021-08-25 DIAGNOSIS — M5416 Radiculopathy, lumbar region: Secondary | ICD-10-CM | POA: Insufficient documentation

## 2021-08-25 DIAGNOSIS — M533 Sacrococcygeal disorders, not elsewhere classified: Secondary | ICD-10-CM | POA: Insufficient documentation

## 2022-04-20 DIAGNOSIS — M5416 Radiculopathy, lumbar region: Secondary | ICD-10-CM | POA: Diagnosis not present

## 2022-04-22 DIAGNOSIS — Z20822 Contact with and (suspected) exposure to covid-19: Secondary | ICD-10-CM | POA: Diagnosis not present

## 2022-04-22 DIAGNOSIS — R059 Cough, unspecified: Secondary | ICD-10-CM | POA: Diagnosis not present

## 2022-04-25 DIAGNOSIS — R6889 Other general symptoms and signs: Secondary | ICD-10-CM | POA: Diagnosis not present

## 2022-04-25 DIAGNOSIS — J40 Bronchitis, not specified as acute or chronic: Secondary | ICD-10-CM | POA: Diagnosis not present

## 2022-05-02 DIAGNOSIS — Z9181 History of falling: Secondary | ICD-10-CM | POA: Diagnosis not present

## 2022-05-02 DIAGNOSIS — J40 Bronchitis, not specified as acute or chronic: Secondary | ICD-10-CM | POA: Diagnosis not present

## 2022-05-09 DIAGNOSIS — M5136 Other intervertebral disc degeneration, lumbar region: Secondary | ICD-10-CM | POA: Diagnosis not present

## 2022-05-09 DIAGNOSIS — M5416 Radiculopathy, lumbar region: Secondary | ICD-10-CM | POA: Diagnosis not present

## 2022-05-09 DIAGNOSIS — M4316 Spondylolisthesis, lumbar region: Secondary | ICD-10-CM | POA: Diagnosis not present

## 2022-05-09 DIAGNOSIS — M4186 Other forms of scoliosis, lumbar region: Secondary | ICD-10-CM | POA: Diagnosis not present

## 2022-05-12 IMAGING — MG MM DIGITAL SCREENING BILAT W/ TOMO AND CAD
8 of 14 series · 8 of 40 positions shown · non-contrast
Comparison: Previous exam(s).

CLINICAL DATA: Screening.

EXAM:
DIGITAL SCREENING BILATERAL MAMMOGRAM WITH TOMOSYNTHESIS AND CAD
TECHNIQUE: Bilateral screening digital craniocaudal and mediolateral oblique
mammograms were obtained. Bilateral screening digital breast
tomosynthesis was performed. The images were evaluated with
computer-aided detection.

[R MLO synth-2D (1 of 2)]
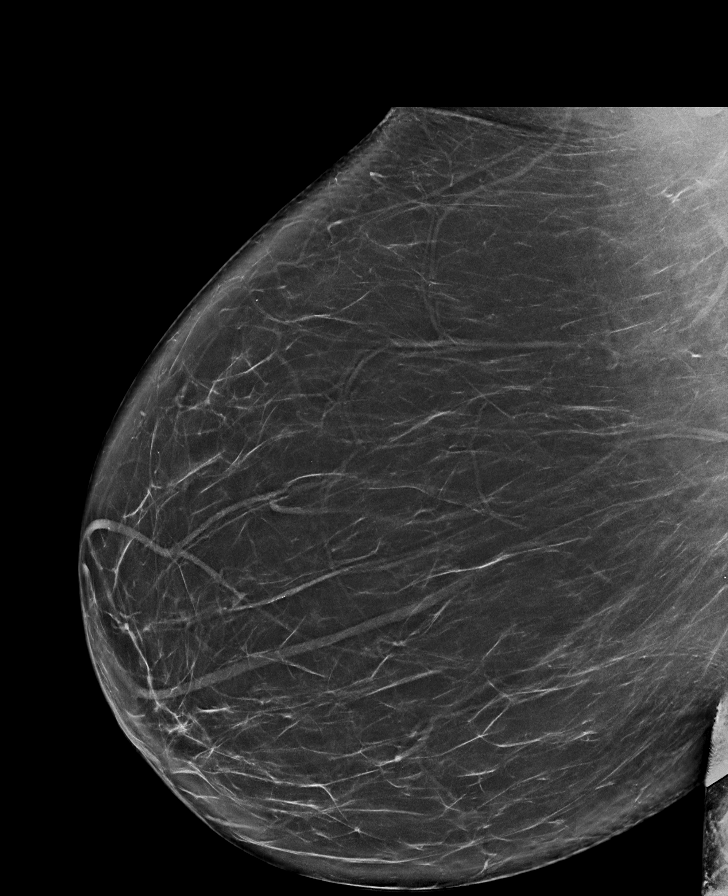

[R CC synth-2D]
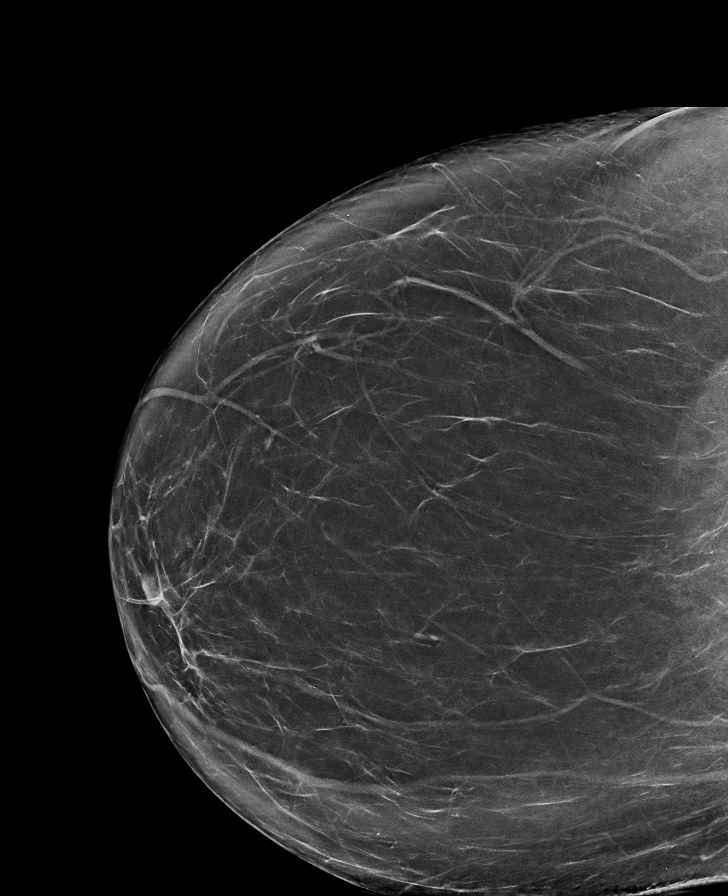

[R MLO synth-2D (2 of 2)]
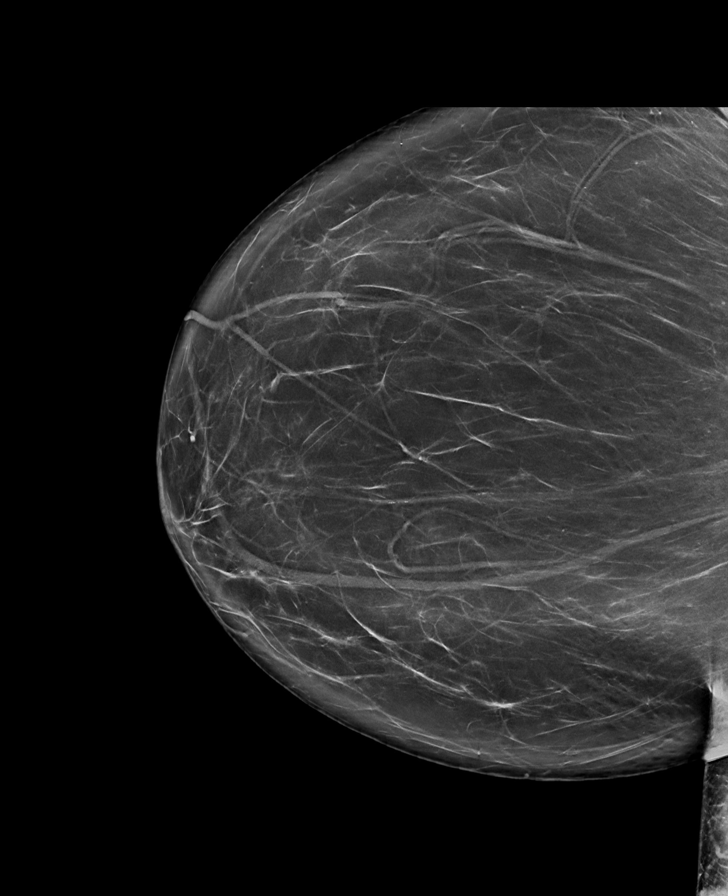

[L CC synth-2D]
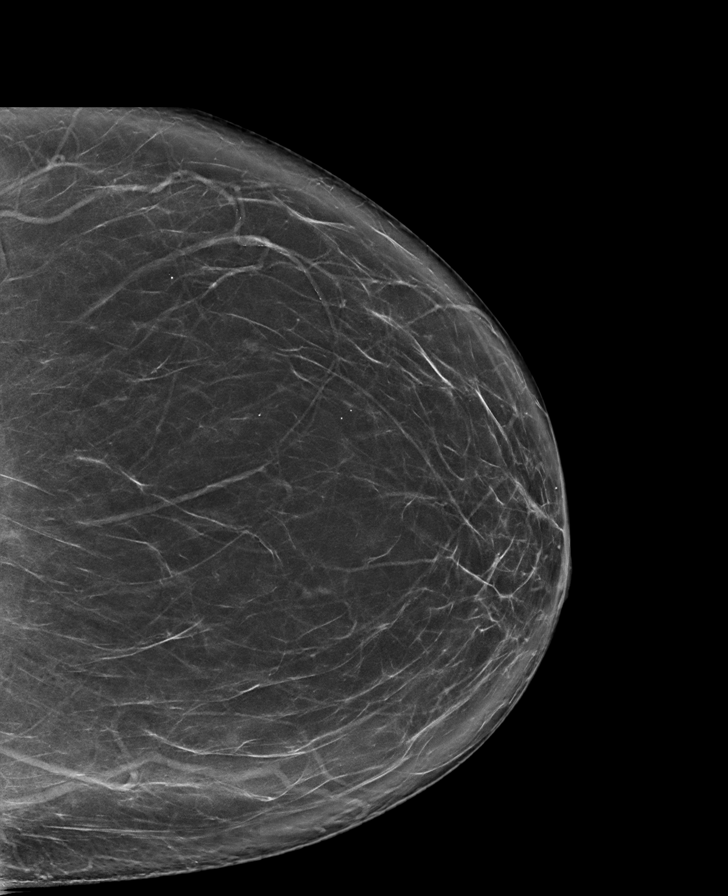

[L MLO synth-2D (1 of 2)]
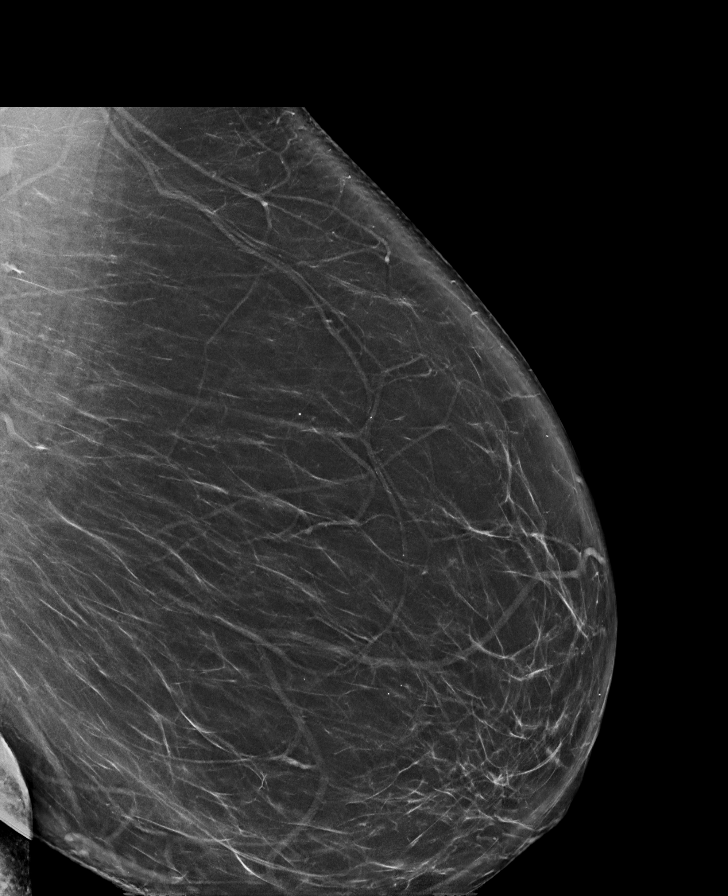

[L MLO synth-2D (2 of 2)]
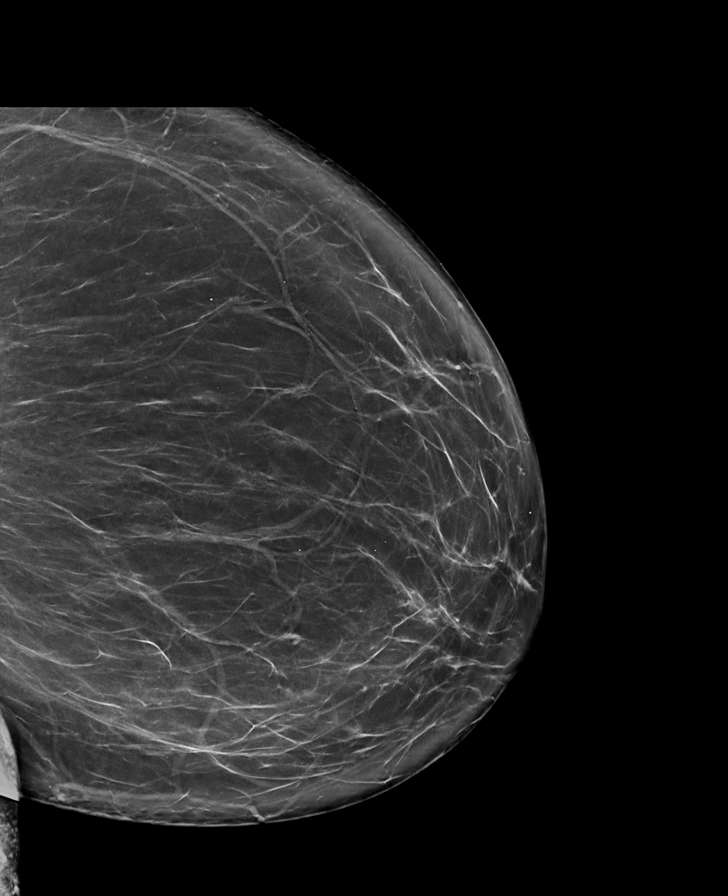

[R CV synth-2D]
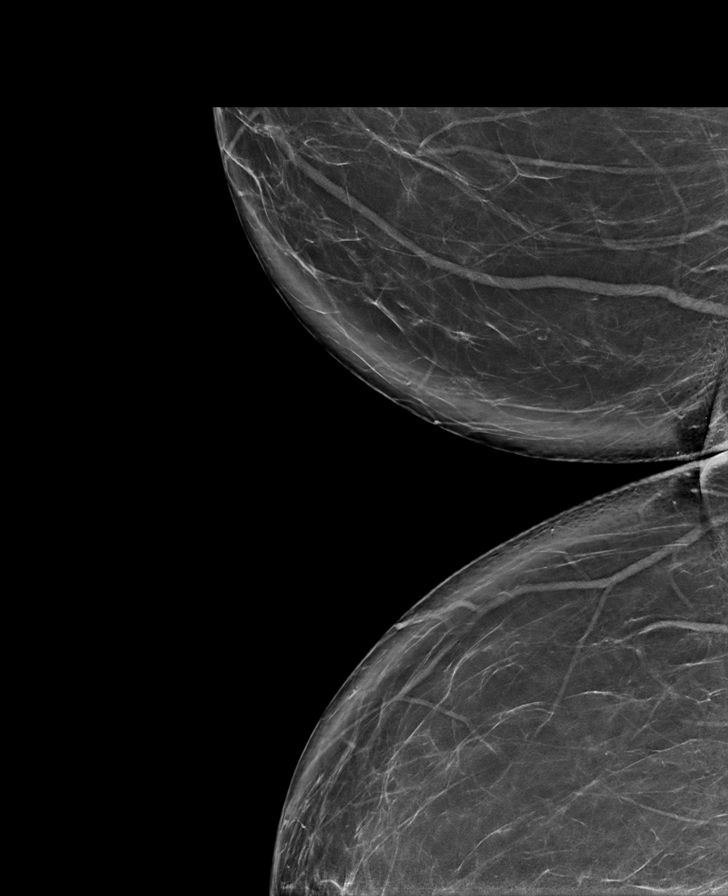

[R CC tomo · tomo slice 46/91.0]
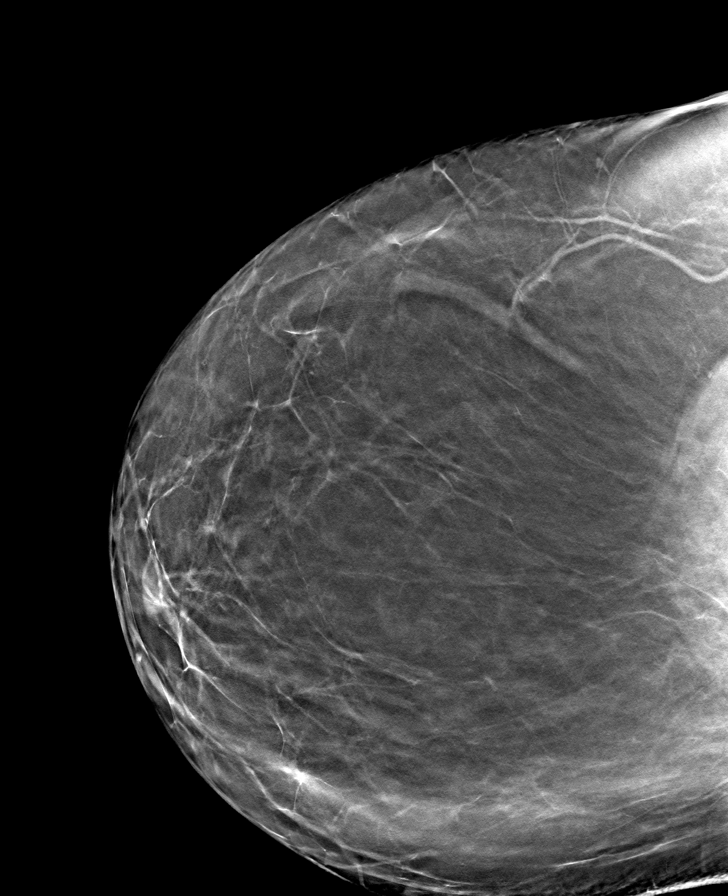

[8 of 40 positions shown; findings below may reference images not displayed]

ACR Breast Density Category b: There are scattered areas of
fibroglandular density.
FINDINGS: There are no findings suspicious for malignancy.
IMPRESSION: No mammographic evidence of malignancy. A result letter of this
screening mammogram will be mailed directly to the patient.

RECOMMENDATION:
Screening mammogram in one year. (Code:51-O-LD2)

BI-RADS CATEGORY  1: Negative.

## 2022-05-15 DIAGNOSIS — M47816 Spondylosis without myelopathy or radiculopathy, lumbar region: Secondary | ICD-10-CM | POA: Diagnosis not present

## 2022-05-15 DIAGNOSIS — M5136 Other intervertebral disc degeneration, lumbar region: Secondary | ICD-10-CM | POA: Diagnosis not present

## 2022-05-17 DIAGNOSIS — E039 Hypothyroidism, unspecified: Secondary | ICD-10-CM | POA: Diagnosis not present

## 2022-05-17 DIAGNOSIS — M5432 Sciatica, left side: Secondary | ICD-10-CM | POA: Diagnosis not present

## 2022-05-17 DIAGNOSIS — I10 Essential (primary) hypertension: Secondary | ICD-10-CM | POA: Diagnosis not present

## 2022-05-17 DIAGNOSIS — I872 Venous insufficiency (chronic) (peripheral): Secondary | ICD-10-CM | POA: Diagnosis not present

## 2022-05-18 ENCOUNTER — Other Ambulatory Visit: Payer: Self-pay | Admitting: Nurse Practitioner

## 2022-05-18 DIAGNOSIS — Z1231 Encounter for screening mammogram for malignant neoplasm of breast: Secondary | ICD-10-CM

## 2022-05-25 DIAGNOSIS — M47816 Spondylosis without myelopathy or radiculopathy, lumbar region: Secondary | ICD-10-CM | POA: Diagnosis not present

## 2022-05-29 ENCOUNTER — Ambulatory Visit
Admission: RE | Admit: 2022-05-29 | Discharge: 2022-05-29 | Disposition: A | Discharge status: Home / Self Care | Payer: 59 | Source: Ambulatory Visit | Attending: Nurse Practitioner | Admitting: Nurse Practitioner

## 2022-05-29 DIAGNOSIS — Z1231 Encounter for screening mammogram for malignant neoplasm of breast: Secondary | ICD-10-CM | POA: Diagnosis not present

## 2022-06-07 DIAGNOSIS — B349 Viral infection, unspecified: Secondary | ICD-10-CM | POA: Diagnosis not present

## 2022-06-07 DIAGNOSIS — J9801 Acute bronchospasm: Secondary | ICD-10-CM | POA: Diagnosis not present

## 2022-06-07 DIAGNOSIS — R6889 Other general symptoms and signs: Secondary | ICD-10-CM | POA: Diagnosis not present

## 2022-06-28 DIAGNOSIS — W19XXXA Unspecified fall, initial encounter: Secondary | ICD-10-CM | POA: Diagnosis not present

## 2022-06-28 DIAGNOSIS — G479 Sleep disorder, unspecified: Secondary | ICD-10-CM | POA: Diagnosis not present

## 2022-06-28 DIAGNOSIS — Y92009 Unspecified place in unspecified non-institutional (private) residence as the place of occurrence of the external cause: Secondary | ICD-10-CM | POA: Diagnosis not present

## 2022-07-09 DIAGNOSIS — M47816 Spondylosis without myelopathy or radiculopathy, lumbar region: Secondary | ICD-10-CM | POA: Diagnosis not present

## 2022-07-16 DIAGNOSIS — R011 Cardiac murmur, unspecified: Secondary | ICD-10-CM | POA: Diagnosis not present

## 2022-07-16 DIAGNOSIS — H6990 Unspecified Eustachian tube disorder, unspecified ear: Secondary | ICD-10-CM | POA: Diagnosis not present

## 2022-08-09 DIAGNOSIS — R5383 Other fatigue: Secondary | ICD-10-CM | POA: Diagnosis not present

## 2022-08-09 DIAGNOSIS — G4733 Obstructive sleep apnea (adult) (pediatric): Secondary | ICD-10-CM | POA: Diagnosis not present

## 2022-08-16 DIAGNOSIS — M47816 Spondylosis without myelopathy or radiculopathy, lumbar region: Secondary | ICD-10-CM | POA: Diagnosis not present

## 2022-08-16 DIAGNOSIS — M461 Sacroiliitis, not elsewhere classified: Secondary | ICD-10-CM | POA: Diagnosis not present

## 2022-08-16 DIAGNOSIS — Z79899 Other long term (current) drug therapy: Secondary | ICD-10-CM | POA: Diagnosis not present

## 2022-08-22 DIAGNOSIS — J3489 Other specified disorders of nose and nasal sinuses: Secondary | ICD-10-CM | POA: Diagnosis not present

## 2022-08-22 DIAGNOSIS — E559 Vitamin D deficiency, unspecified: Secondary | ICD-10-CM | POA: Diagnosis not present

## 2022-08-22 DIAGNOSIS — I10 Essential (primary) hypertension: Secondary | ICD-10-CM | POA: Diagnosis not present

## 2022-08-27 DIAGNOSIS — Z961 Presence of intraocular lens: Secondary | ICD-10-CM | POA: Diagnosis not present

## 2022-08-27 DIAGNOSIS — H538 Other visual disturbances: Secondary | ICD-10-CM | POA: Diagnosis not present

## 2022-09-21 DIAGNOSIS — I10 Essential (primary) hypertension: Secondary | ICD-10-CM | POA: Diagnosis not present

## 2022-09-21 DIAGNOSIS — Z139 Encounter for screening, unspecified: Secondary | ICD-10-CM | POA: Diagnosis not present

## 2022-09-21 DIAGNOSIS — G2581 Restless legs syndrome: Secondary | ICD-10-CM | POA: Diagnosis not present

## 2022-09-21 DIAGNOSIS — I872 Venous insufficiency (chronic) (peripheral): Secondary | ICD-10-CM | POA: Diagnosis not present

## 2022-11-16 DIAGNOSIS — E039 Hypothyroidism, unspecified: Secondary | ICD-10-CM | POA: Diagnosis not present

## 2022-11-16 DIAGNOSIS — I10 Essential (primary) hypertension: Secondary | ICD-10-CM | POA: Diagnosis not present

## 2022-11-16 DIAGNOSIS — J3489 Other specified disorders of nose and nasal sinuses: Secondary | ICD-10-CM | POA: Diagnosis not present

## 2022-11-16 DIAGNOSIS — R768 Other specified abnormal immunological findings in serum: Secondary | ICD-10-CM | POA: Diagnosis not present

## 2022-11-16 DIAGNOSIS — E785 Hyperlipidemia, unspecified: Secondary | ICD-10-CM | POA: Diagnosis not present

## 2022-11-16 DIAGNOSIS — M79672 Pain in left foot: Secondary | ICD-10-CM | POA: Diagnosis not present

## 2023-01-17 DIAGNOSIS — Z9181 History of falling: Secondary | ICD-10-CM | POA: Diagnosis not present

## 2023-01-17 DIAGNOSIS — Z139 Encounter for screening, unspecified: Secondary | ICD-10-CM | POA: Diagnosis not present

## 2023-01-17 DIAGNOSIS — Z0000 Encounter for general adult medical examination without abnormal findings: Secondary | ICD-10-CM | POA: Diagnosis not present

## 2023-01-25 DIAGNOSIS — J342 Deviated nasal septum: Secondary | ICD-10-CM | POA: Diagnosis not present

## 2023-01-25 DIAGNOSIS — R0981 Nasal congestion: Secondary | ICD-10-CM | POA: Diagnosis not present

## 2023-02-04 DIAGNOSIS — M47816 Spondylosis without myelopathy or radiculopathy, lumbar region: Secondary | ICD-10-CM | POA: Diagnosis not present

## 2023-02-20 DIAGNOSIS — E785 Hyperlipidemia, unspecified: Secondary | ICD-10-CM | POA: Diagnosis not present

## 2023-02-20 DIAGNOSIS — M25512 Pain in left shoulder: Secondary | ICD-10-CM | POA: Diagnosis not present

## 2023-02-20 DIAGNOSIS — Z8241 Family history of sudden cardiac death: Secondary | ICD-10-CM | POA: Diagnosis not present

## 2023-02-20 DIAGNOSIS — J3489 Other specified disorders of nose and nasal sinuses: Secondary | ICD-10-CM | POA: Diagnosis not present

## 2023-02-25 DIAGNOSIS — H538 Other visual disturbances: Secondary | ICD-10-CM | POA: Diagnosis not present

## 2023-02-25 DIAGNOSIS — H43812 Vitreous degeneration, left eye: Secondary | ICD-10-CM | POA: Diagnosis not present

## 2023-02-25 DIAGNOSIS — Z961 Presence of intraocular lens: Secondary | ICD-10-CM | POA: Diagnosis not present

## 2023-02-26 DIAGNOSIS — R42 Dizziness and giddiness: Secondary | ICD-10-CM | POA: Diagnosis not present

## 2023-02-26 DIAGNOSIS — G2581 Restless legs syndrome: Secondary | ICD-10-CM | POA: Diagnosis not present

## 2023-02-26 DIAGNOSIS — R2689 Other abnormalities of gait and mobility: Secondary | ICD-10-CM | POA: Diagnosis not present

## 2023-02-26 DIAGNOSIS — M545 Low back pain, unspecified: Secondary | ICD-10-CM | POA: Diagnosis not present

## 2023-02-26 DIAGNOSIS — G8929 Other chronic pain: Secondary | ICD-10-CM | POA: Diagnosis not present

## 2023-02-26 DIAGNOSIS — J3489 Other specified disorders of nose and nasal sinuses: Secondary | ICD-10-CM | POA: Diagnosis not present

## 2023-03-19 DIAGNOSIS — M160 Bilateral primary osteoarthritis of hip: Secondary | ICD-10-CM | POA: Diagnosis not present

## 2023-03-19 DIAGNOSIS — M439 Deforming dorsopathy, unspecified: Secondary | ICD-10-CM | POA: Diagnosis not present

## 2023-03-19 DIAGNOSIS — R7982 Elevated C-reactive protein (CRP): Secondary | ICD-10-CM | POA: Diagnosis not present

## 2023-03-19 DIAGNOSIS — S6992XA Unspecified injury of left wrist, hand and finger(s), initial encounter: Secondary | ICD-10-CM | POA: Diagnosis not present

## 2023-03-19 DIAGNOSIS — Z87828 Personal history of other (healed) physical injury and trauma: Secondary | ICD-10-CM | POA: Insufficient documentation

## 2023-03-19 DIAGNOSIS — M5010 Cervical disc disorder with radiculopathy, unspecified cervical region: Secondary | ICD-10-CM | POA: Diagnosis not present

## 2023-03-19 DIAGNOSIS — M79672 Pain in left foot: Secondary | ICD-10-CM | POA: Diagnosis not present

## 2023-03-19 DIAGNOSIS — M19042 Primary osteoarthritis, left hand: Secondary | ICD-10-CM | POA: Diagnosis not present

## 2023-03-19 DIAGNOSIS — E559 Vitamin D deficiency, unspecified: Secondary | ICD-10-CM | POA: Diagnosis not present

## 2023-03-19 DIAGNOSIS — M461 Sacroiliitis, not elsewhere classified: Secondary | ICD-10-CM | POA: Diagnosis not present

## 2023-03-19 DIAGNOSIS — M150 Primary generalized (osteo)arthritis: Secondary | ICD-10-CM | POA: Diagnosis not present

## 2023-03-19 DIAGNOSIS — M4726 Other spondylosis with radiculopathy, lumbar region: Secondary | ICD-10-CM | POA: Diagnosis not present

## 2023-03-19 DIAGNOSIS — S199XXA Unspecified injury of neck, initial encounter: Secondary | ICD-10-CM | POA: Diagnosis not present

## 2023-03-19 DIAGNOSIS — M5116 Intervertebral disc disorders with radiculopathy, lumbar region: Secondary | ICD-10-CM | POA: Diagnosis not present

## 2023-03-19 DIAGNOSIS — S99922A Unspecified injury of left foot, initial encounter: Secondary | ICD-10-CM | POA: Diagnosis not present

## 2023-03-19 DIAGNOSIS — M5416 Radiculopathy, lumbar region: Secondary | ICD-10-CM | POA: Diagnosis not present

## 2023-03-19 DIAGNOSIS — G8929 Other chronic pain: Secondary | ICD-10-CM | POA: Diagnosis not present

## 2023-03-19 DIAGNOSIS — S99921A Unspecified injury of right foot, initial encounter: Secondary | ICD-10-CM | POA: Diagnosis not present

## 2023-03-19 DIAGNOSIS — R768 Other specified abnormal immunological findings in serum: Secondary | ICD-10-CM | POA: Diagnosis not present

## 2023-03-19 DIAGNOSIS — M7752 Other enthesopathy of left foot: Secondary | ICD-10-CM | POA: Diagnosis not present

## 2023-03-19 DIAGNOSIS — M25542 Pain in joints of left hand: Secondary | ICD-10-CM | POA: Diagnosis not present

## 2023-03-19 DIAGNOSIS — M1991 Primary osteoarthritis, unspecified site: Secondary | ICD-10-CM | POA: Diagnosis not present

## 2023-03-19 DIAGNOSIS — S6991XA Unspecified injury of right wrist, hand and finger(s), initial encounter: Secondary | ICD-10-CM | POA: Diagnosis not present

## 2023-03-19 DIAGNOSIS — Z9889 Other specified postprocedural states: Secondary | ICD-10-CM | POA: Insufficient documentation

## 2023-04-04 DIAGNOSIS — Z87828 Personal history of other (healed) physical injury and trauma: Secondary | ICD-10-CM | POA: Diagnosis not present

## 2023-04-04 DIAGNOSIS — M1991 Primary osteoarthritis, unspecified site: Secondary | ICD-10-CM | POA: Diagnosis not present

## 2023-04-04 DIAGNOSIS — S3992XA Unspecified injury of lower back, initial encounter: Secondary | ICD-10-CM | POA: Diagnosis not present

## 2023-04-04 DIAGNOSIS — M4726 Other spondylosis with radiculopathy, lumbar region: Secondary | ICD-10-CM | POA: Diagnosis not present

## 2023-04-04 DIAGNOSIS — M150 Primary generalized (osteo)arthritis: Secondary | ICD-10-CM | POA: Diagnosis not present

## 2023-04-04 DIAGNOSIS — M5416 Radiculopathy, lumbar region: Secondary | ICD-10-CM | POA: Diagnosis not present

## 2023-05-08 ENCOUNTER — Other Ambulatory Visit: Payer: Self-pay | Admitting: Nurse Practitioner

## 2023-05-08 DIAGNOSIS — Z1231 Encounter for screening mammogram for malignant neoplasm of breast: Secondary | ICD-10-CM

## 2023-05-13 DIAGNOSIS — G8929 Other chronic pain: Secondary | ICD-10-CM | POA: Diagnosis not present

## 2023-05-13 DIAGNOSIS — G44209 Tension-type headache, unspecified, not intractable: Secondary | ICD-10-CM | POA: Diagnosis not present

## 2023-05-13 DIAGNOSIS — J3489 Other specified disorders of nose and nasal sinuses: Secondary | ICD-10-CM | POA: Diagnosis not present

## 2023-05-13 DIAGNOSIS — M545 Low back pain, unspecified: Secondary | ICD-10-CM | POA: Diagnosis not present

## 2023-05-13 DIAGNOSIS — G2581 Restless legs syndrome: Secondary | ICD-10-CM | POA: Diagnosis not present

## 2023-05-13 DIAGNOSIS — R42 Dizziness and giddiness: Secondary | ICD-10-CM | POA: Diagnosis not present

## 2023-05-17 ENCOUNTER — Other Ambulatory Visit: Payer: Self-pay | Admitting: Physician Assistant

## 2023-05-17 DIAGNOSIS — R519 Headache, unspecified: Secondary | ICD-10-CM

## 2023-05-17 DIAGNOSIS — R42 Dizziness and giddiness: Secondary | ICD-10-CM

## 2023-05-17 DIAGNOSIS — R29818 Other symptoms and signs involving the nervous system: Secondary | ICD-10-CM

## 2023-05-23 DIAGNOSIS — Z79899 Other long term (current) drug therapy: Secondary | ICD-10-CM | POA: Diagnosis not present

## 2023-05-23 DIAGNOSIS — M47816 Spondylosis without myelopathy or radiculopathy, lumbar region: Secondary | ICD-10-CM | POA: Diagnosis not present

## 2023-05-24 DIAGNOSIS — M539 Dorsopathy, unspecified: Secondary | ICD-10-CM | POA: Diagnosis not present

## 2023-05-24 DIAGNOSIS — D72829 Elevated white blood cell count, unspecified: Secondary | ICD-10-CM | POA: Diagnosis not present

## 2023-05-24 DIAGNOSIS — E785 Hyperlipidemia, unspecified: Secondary | ICD-10-CM | POA: Diagnosis not present

## 2023-05-24 DIAGNOSIS — G5602 Carpal tunnel syndrome, left upper limb: Secondary | ICD-10-CM | POA: Diagnosis not present

## 2023-05-24 DIAGNOSIS — E039 Hypothyroidism, unspecified: Secondary | ICD-10-CM | POA: Diagnosis not present

## 2023-05-24 DIAGNOSIS — I10 Essential (primary) hypertension: Secondary | ICD-10-CM | POA: Diagnosis not present

## 2023-05-25 ENCOUNTER — Ambulatory Visit
Admission: RE | Admit: 2023-05-25 | Discharge: 2023-05-25 | Disposition: A | Discharge status: Home / Self Care | Payer: 59 | Source: Ambulatory Visit | Attending: Physician Assistant | Admitting: Physician Assistant

## 2023-05-25 DIAGNOSIS — R519 Headache, unspecified: Secondary | ICD-10-CM | POA: Diagnosis not present

## 2023-05-25 DIAGNOSIS — R42 Dizziness and giddiness: Secondary | ICD-10-CM | POA: Diagnosis not present

## 2023-05-25 DIAGNOSIS — R251 Tremor, unspecified: Secondary | ICD-10-CM | POA: Diagnosis not present

## 2023-05-25 DIAGNOSIS — R29818 Other symptoms and signs involving the nervous system: Secondary | ICD-10-CM

## 2023-06-05 ENCOUNTER — Ambulatory Visit
Admission: RE | Admit: 2023-06-05 | Discharge: 2023-06-05 | Disposition: A | Discharge status: Home / Self Care | Payer: 59 | Source: Ambulatory Visit | Attending: Nurse Practitioner | Admitting: Nurse Practitioner

## 2023-06-05 DIAGNOSIS — Z1231 Encounter for screening mammogram for malignant neoplasm of breast: Secondary | ICD-10-CM | POA: Diagnosis not present

## 2023-06-12 DIAGNOSIS — M5416 Radiculopathy, lumbar region: Secondary | ICD-10-CM | POA: Diagnosis not present

## 2023-06-21 DIAGNOSIS — M5416 Radiculopathy, lumbar region: Secondary | ICD-10-CM | POA: Diagnosis not present

## 2023-06-26 DIAGNOSIS — D72829 Elevated white blood cell count, unspecified: Secondary | ICD-10-CM | POA: Diagnosis not present

## 2023-07-01 DIAGNOSIS — J3489 Other specified disorders of nose and nasal sinuses: Secondary | ICD-10-CM | POA: Diagnosis not present

## 2023-07-01 DIAGNOSIS — R519 Headache, unspecified: Secondary | ICD-10-CM | POA: Diagnosis not present

## 2023-07-01 DIAGNOSIS — G2581 Restless legs syndrome: Secondary | ICD-10-CM | POA: Diagnosis not present

## 2023-07-01 DIAGNOSIS — G44209 Tension-type headache, unspecified, not intractable: Secondary | ICD-10-CM | POA: Diagnosis not present

## 2023-07-01 DIAGNOSIS — R2689 Other abnormalities of gait and mobility: Secondary | ICD-10-CM | POA: Diagnosis not present

## 2023-07-01 DIAGNOSIS — R42 Dizziness and giddiness: Secondary | ICD-10-CM | POA: Diagnosis not present

## 2023-07-03 DIAGNOSIS — M19042 Primary osteoarthritis, left hand: Secondary | ICD-10-CM | POA: Diagnosis not present

## 2023-07-03 DIAGNOSIS — G5603 Carpal tunnel syndrome, bilateral upper limbs: Secondary | ICD-10-CM | POA: Diagnosis not present

## 2023-07-17 DIAGNOSIS — G5602 Carpal tunnel syndrome, left upper limb: Secondary | ICD-10-CM | POA: Diagnosis not present

## 2023-07-24 DIAGNOSIS — M5416 Radiculopathy, lumbar region: Secondary | ICD-10-CM | POA: Diagnosis not present

## 2023-07-30 DIAGNOSIS — R7982 Elevated C-reactive protein (CRP): Secondary | ICD-10-CM | POA: Diagnosis not present

## 2023-07-30 DIAGNOSIS — Z87828 Personal history of other (healed) physical injury and trauma: Secondary | ICD-10-CM | POA: Diagnosis not present

## 2023-07-30 DIAGNOSIS — M533 Sacrococcygeal disorders, not elsewhere classified: Secondary | ICD-10-CM | POA: Diagnosis not present

## 2023-07-30 DIAGNOSIS — M5136 Other intervertebral disc degeneration, lumbar region with discogenic back pain only: Secondary | ICD-10-CM | POA: Diagnosis not present

## 2023-07-30 DIAGNOSIS — M150 Primary generalized (osteo)arthritis: Secondary | ICD-10-CM | POA: Diagnosis not present

## 2023-07-30 DIAGNOSIS — R220 Localized swelling, mass and lump, head: Secondary | ICD-10-CM | POA: Diagnosis not present

## 2023-07-30 DIAGNOSIS — M5416 Radiculopathy, lumbar region: Secondary | ICD-10-CM | POA: Diagnosis not present

## 2023-07-30 DIAGNOSIS — M439 Deforming dorsopathy, unspecified: Secondary | ICD-10-CM | POA: Diagnosis not present

## 2023-07-30 DIAGNOSIS — M25542 Pain in joints of left hand: Secondary | ICD-10-CM | POA: Diagnosis not present

## 2023-07-30 DIAGNOSIS — G8929 Other chronic pain: Secondary | ICD-10-CM | POA: Diagnosis not present

## 2023-07-30 DIAGNOSIS — M79672 Pain in left foot: Secondary | ICD-10-CM | POA: Diagnosis not present

## 2023-07-30 DIAGNOSIS — R0989 Other specified symptoms and signs involving the circulatory and respiratory systems: Secondary | ICD-10-CM | POA: Diagnosis not present

## 2023-08-01 DIAGNOSIS — G5602 Carpal tunnel syndrome, left upper limb: Secondary | ICD-10-CM | POA: Diagnosis not present

## 2023-08-01 DIAGNOSIS — M5412 Radiculopathy, cervical region: Secondary | ICD-10-CM | POA: Diagnosis not present

## 2023-08-01 DIAGNOSIS — M5090 Cervical disc disorder, unspecified, unspecified cervical region: Secondary | ICD-10-CM | POA: Diagnosis not present

## 2023-08-12 DIAGNOSIS — E785 Hyperlipidemia, unspecified: Secondary | ICD-10-CM | POA: Diagnosis not present

## 2023-08-12 DIAGNOSIS — I10 Essential (primary) hypertension: Secondary | ICD-10-CM | POA: Diagnosis not present

## 2023-08-12 DIAGNOSIS — G5602 Carpal tunnel syndrome, left upper limb: Secondary | ICD-10-CM | POA: Diagnosis not present

## 2023-08-13 ENCOUNTER — Ambulatory Visit: Payer: 59 | Admitting: Cardiology

## 2023-08-23 DIAGNOSIS — M5416 Radiculopathy, lumbar region: Secondary | ICD-10-CM | POA: Diagnosis not present

## 2023-08-23 DIAGNOSIS — Z9889 Other specified postprocedural states: Secondary | ICD-10-CM | POA: Diagnosis not present

## 2023-08-23 DIAGNOSIS — I10 Essential (primary) hypertension: Secondary | ICD-10-CM | POA: Diagnosis not present

## 2023-08-23 DIAGNOSIS — G2581 Restless legs syndrome: Secondary | ICD-10-CM | POA: Diagnosis not present

## 2023-08-23 DIAGNOSIS — E039 Hypothyroidism, unspecified: Secondary | ICD-10-CM | POA: Diagnosis not present

## 2023-08-23 DIAGNOSIS — E785 Hyperlipidemia, unspecified: Secondary | ICD-10-CM | POA: Diagnosis not present

## 2023-08-26 DIAGNOSIS — M5451 Vertebrogenic low back pain: Secondary | ICD-10-CM | POA: Insufficient documentation

## 2023-08-26 DIAGNOSIS — M47816 Spondylosis without myelopathy or radiculopathy, lumbar region: Secondary | ICD-10-CM | POA: Diagnosis not present

## 2023-08-26 DIAGNOSIS — M5416 Radiculopathy, lumbar region: Secondary | ICD-10-CM | POA: Diagnosis not present

## 2023-09-03 DIAGNOSIS — G44209 Tension-type headache, unspecified, not intractable: Secondary | ICD-10-CM | POA: Diagnosis not present

## 2023-09-03 DIAGNOSIS — R2689 Other abnormalities of gait and mobility: Secondary | ICD-10-CM | POA: Diagnosis not present

## 2023-09-03 DIAGNOSIS — R42 Dizziness and giddiness: Secondary | ICD-10-CM | POA: Diagnosis not present

## 2023-09-03 DIAGNOSIS — R519 Headache, unspecified: Secondary | ICD-10-CM | POA: Diagnosis not present

## 2023-09-03 DIAGNOSIS — G2581 Restless legs syndrome: Secondary | ICD-10-CM | POA: Diagnosis not present

## 2023-10-16 DIAGNOSIS — R42 Dizziness and giddiness: Secondary | ICD-10-CM | POA: Diagnosis not present

## 2023-10-16 DIAGNOSIS — R600 Localized edema: Secondary | ICD-10-CM | POA: Diagnosis not present

## 2023-10-23 DIAGNOSIS — Z1211 Encounter for screening for malignant neoplasm of colon: Secondary | ICD-10-CM | POA: Diagnosis not present

## 2023-10-23 DIAGNOSIS — R2689 Other abnormalities of gait and mobility: Secondary | ICD-10-CM | POA: Diagnosis not present

## 2023-10-23 DIAGNOSIS — M5416 Radiculopathy, lumbar region: Secondary | ICD-10-CM | POA: Diagnosis not present

## 2023-10-23 DIAGNOSIS — N181 Chronic kidney disease, stage 1: Secondary | ICD-10-CM | POA: Diagnosis not present

## 2023-10-25 DIAGNOSIS — M4726 Other spondylosis with radiculopathy, lumbar region: Secondary | ICD-10-CM | POA: Diagnosis not present

## 2023-10-25 DIAGNOSIS — M5451 Vertebrogenic low back pain: Secondary | ICD-10-CM | POA: Diagnosis not present

## 2023-10-25 DIAGNOSIS — M5116 Intervertebral disc disorders with radiculopathy, lumbar region: Secondary | ICD-10-CM | POA: Diagnosis not present

## 2023-10-25 DIAGNOSIS — M461 Sacroiliitis, not elsewhere classified: Secondary | ICD-10-CM | POA: Diagnosis not present

## 2023-10-25 DIAGNOSIS — M533 Sacrococcygeal disorders, not elsewhere classified: Secondary | ICD-10-CM | POA: Diagnosis not present

## 2023-10-25 DIAGNOSIS — Z79899 Other long term (current) drug therapy: Secondary | ICD-10-CM | POA: Diagnosis not present

## 2023-10-25 DIAGNOSIS — I10 Essential (primary) hypertension: Secondary | ICD-10-CM | POA: Diagnosis not present

## 2023-10-31 DIAGNOSIS — J329 Chronic sinusitis, unspecified: Secondary | ICD-10-CM | POA: Diagnosis not present

## 2023-10-31 DIAGNOSIS — R220 Localized swelling, mass and lump, head: Secondary | ICD-10-CM | POA: Diagnosis not present

## 2023-10-31 DIAGNOSIS — M799 Soft tissue disorder, unspecified: Secondary | ICD-10-CM | POA: Diagnosis not present

## 2023-10-31 DIAGNOSIS — R0989 Other specified symptoms and signs involving the circulatory and respiratory systems: Secondary | ICD-10-CM | POA: Diagnosis not present

## 2023-11-04 DIAGNOSIS — N181 Chronic kidney disease, stage 1: Secondary | ICD-10-CM | POA: Diagnosis not present

## 2023-11-04 DIAGNOSIS — J323 Chronic sphenoidal sinusitis: Secondary | ICD-10-CM | POA: Diagnosis not present

## 2023-11-04 DIAGNOSIS — J342 Deviated nasal septum: Secondary | ICD-10-CM | POA: Diagnosis not present

## 2023-11-06 ENCOUNTER — Ambulatory Visit: Admitting: Podiatry

## 2023-11-11 DIAGNOSIS — M47816 Spondylosis without myelopathy or radiculopathy, lumbar region: Secondary | ICD-10-CM | POA: Diagnosis not present

## 2023-11-11 DIAGNOSIS — R29898 Other symptoms and signs involving the musculoskeletal system: Secondary | ICD-10-CM | POA: Diagnosis not present

## 2023-11-14 ENCOUNTER — Ambulatory Visit (INDEPENDENT_AMBULATORY_CARE_PROVIDER_SITE_OTHER)

## 2023-11-14 ENCOUNTER — Ambulatory Visit (INDEPENDENT_AMBULATORY_CARE_PROVIDER_SITE_OTHER): Admitting: Podiatry

## 2023-11-14 DIAGNOSIS — M7752 Other enthesopathy of left foot: Secondary | ICD-10-CM

## 2023-11-14 DIAGNOSIS — M21612 Bunion of left foot: Secondary | ICD-10-CM | POA: Diagnosis not present

## 2023-11-14 DIAGNOSIS — M778 Other enthesopathies, not elsewhere classified: Secondary | ICD-10-CM

## 2023-11-14 DIAGNOSIS — M792 Neuralgia and neuritis, unspecified: Secondary | ICD-10-CM

## 2023-11-14 MED ORDER — GABAPENTIN 100 MG PO CAPS: 100.0000 mg | ORAL_CAPSULE | Freq: Every day | ORAL | 0 refills | Status: DC

## 2023-11-14 NOTE — Progress Notes (Signed)
  Chief Complaint  Patient presents with   Foot Pain    Left foot pain from the bunion to the arch. It always feels swollen and is sensitive to touch, not as intense as pins and needles, feels like its asleep. Massage helps for a few mins but not long. Not diabetic and no anti coag.    HPI: 73 y.o. female presents today with pain in the bunion of the left foot.  Describes mostly a burning type of pain in the area.  States it is worse at bedtime.  States that it is not directly related to whether pressure is pushing against it.  She already takes 600 mg of gabapentin  at night.  She has been on this dosage for the past 2 years.  She had a plantar fascial release on the same foot in the past.  Past Medical History:  Diagnosis Date   Hyperlipidemia    Hypertension    Thyroid  disease    Past Surgical History:  Procedure Laterality Date   CESAREAN SECTION     2 previous   TUBAL LIGATION     Allergies  Allergen Reactions   Tramadol Itching   Codeine    Physical Exam: Palpable pedal pulses.  No edema is appreciated.  There is pain with percussion/+ Tinel's sign along the proper digital nerve along the medial aspect of the left bunion.  Lateral angulation of the hallux which is manually reducible.  Bony prominence on the medial aspect of the first metatarsal head on the left foot.  Epicritic sensation is intact.  No crepitus with range of motion of the first MPJ.  Radiographic Exam (left foot, 3 weightbearing views, 11/14/2023):  Normal osseous mineralization. No fractures noted.  Mild decrease in joint space at the first MPJ.  First intermetatarsal angle is 12 to 13 degrees.  Increased hallux abductus angle.  Tibial sesamoid position is 3.  Small posterior calcaneal spur present  Assessment/Plan of Care: 1. Bunion, left foot   2. Capsulitis of left foot   3. Neuritis      Meds ordered this encounter  Medications   gabapentin  (NEURONTIN ) 100 MG capsule    Sig: Take 1 capsule (100 mg  total) by mouth at bedtime. Take this capsule, along with your 600mg  capsule, before going to bed each night.    Dispense:  30 capsule    Refill:  0   Will send in 100 mg of gabapentin  for the patient to take an extra 100 mg on top of the 600 mg only at night.  Will see if this helps her nighttime neuropathy/neuritis symptoms.  Recommend Voltaren  gel massaged into the area twice daily.  A gel toe spacer was dispensed.  If she does not have any relief with conservative measures, would recommend bunion correction.  Follow-up as needed  Awanda CHARM Imperial, DPM, FACFAS [facility]     [address] [place], [place] [address]                Office [phone]  Fax [phone]

## 2023-12-05 ENCOUNTER — Other Ambulatory Visit: Payer: Self-pay | Admitting: Physician Assistant

## 2023-12-05 DIAGNOSIS — R2689 Other abnormalities of gait and mobility: Secondary | ICD-10-CM

## 2023-12-05 DIAGNOSIS — G2581 Restless legs syndrome: Secondary | ICD-10-CM | POA: Diagnosis not present

## 2023-12-05 DIAGNOSIS — G44209 Tension-type headache, unspecified, not intractable: Secondary | ICD-10-CM | POA: Diagnosis not present

## 2023-12-05 DIAGNOSIS — R29818 Other symptoms and signs involving the nervous system: Secondary | ICD-10-CM

## 2023-12-05 DIAGNOSIS — R519 Headache, unspecified: Secondary | ICD-10-CM | POA: Diagnosis not present

## 2023-12-05 DIAGNOSIS — R42 Dizziness and giddiness: Secondary | ICD-10-CM | POA: Diagnosis not present

## 2023-12-05 DIAGNOSIS — J3489 Other specified disorders of nose and nasal sinuses: Secondary | ICD-10-CM

## 2023-12-06 DIAGNOSIS — M25512 Pain in left shoulder: Secondary | ICD-10-CM | POA: Diagnosis not present

## 2023-12-10 DIAGNOSIS — R29898 Other symptoms and signs involving the musculoskeletal system: Secondary | ICD-10-CM | POA: Diagnosis not present

## 2023-12-10 DIAGNOSIS — M5451 Vertebrogenic low back pain: Secondary | ICD-10-CM | POA: Diagnosis not present

## 2023-12-10 DIAGNOSIS — M51362 Other intervertebral disc degeneration, lumbar region with discogenic back pain and lower extremity pain: Secondary | ICD-10-CM | POA: Diagnosis not present

## 2023-12-10 DIAGNOSIS — M5416 Radiculopathy, lumbar region: Secondary | ICD-10-CM | POA: Diagnosis not present

## 2023-12-12 ENCOUNTER — Ambulatory Visit
Admission: RE | Admit: 2023-12-12 | Discharge: 2023-12-12 | Disposition: A | Discharge status: Home / Self Care | Source: Ambulatory Visit | Attending: Physician Assistant | Admitting: Physician Assistant

## 2023-12-12 ENCOUNTER — Other Ambulatory Visit: Payer: Self-pay | Admitting: Physician Assistant

## 2023-12-12 DIAGNOSIS — J3489 Other specified disorders of nose and nasal sinuses: Secondary | ICD-10-CM

## 2023-12-12 DIAGNOSIS — R519 Headache, unspecified: Secondary | ICD-10-CM | POA: Diagnosis not present

## 2023-12-12 DIAGNOSIS — R2689 Other abnormalities of gait and mobility: Secondary | ICD-10-CM

## 2023-12-12 DIAGNOSIS — R42 Dizziness and giddiness: Secondary | ICD-10-CM | POA: Diagnosis not present

## 2023-12-12 DIAGNOSIS — R29818 Other symptoms and signs involving the nervous system: Secondary | ICD-10-CM

## 2023-12-12 MED ORDER — GADOPICLENOL 0.5 MMOL/ML IV SOLN
10.0000 mL | Freq: Once | INTRAVENOUS | Status: AC | PRN
  Administered 2023-12-12: 10 mL via INTRAVENOUS

## 2023-12-13 ENCOUNTER — Other Ambulatory Visit: Payer: Self-pay | Admitting: Podiatry

## 2023-12-16 DIAGNOSIS — M5416 Radiculopathy, lumbar region: Secondary | ICD-10-CM | POA: Diagnosis not present

## 2023-12-16 DIAGNOSIS — R600 Localized edema: Secondary | ICD-10-CM | POA: Diagnosis not present

## 2023-12-16 DIAGNOSIS — M7989 Other specified soft tissue disorders: Secondary | ICD-10-CM | POA: Diagnosis not present

## 2023-12-16 DIAGNOSIS — R7989 Other specified abnormal findings of blood chemistry: Secondary | ICD-10-CM | POA: Diagnosis not present

## 2023-12-23 DIAGNOSIS — M25512 Pain in left shoulder: Secondary | ICD-10-CM | POA: Diagnosis not present

## 2023-12-23 DIAGNOSIS — R293 Abnormal posture: Secondary | ICD-10-CM | POA: Diagnosis not present

## 2023-12-23 DIAGNOSIS — M6281 Muscle weakness (generalized): Secondary | ICD-10-CM | POA: Diagnosis not present

## 2023-12-23 DIAGNOSIS — M5416 Radiculopathy, lumbar region: Secondary | ICD-10-CM | POA: Diagnosis not present

## 2023-12-23 DIAGNOSIS — M25612 Stiffness of left shoulder, not elsewhere classified: Secondary | ICD-10-CM | POA: Diagnosis not present

## 2023-12-26 DIAGNOSIS — M25512 Pain in left shoulder: Secondary | ICD-10-CM | POA: Diagnosis not present

## 2023-12-26 DIAGNOSIS — R293 Abnormal posture: Secondary | ICD-10-CM | POA: Diagnosis not present

## 2023-12-26 DIAGNOSIS — M6281 Muscle weakness (generalized): Secondary | ICD-10-CM | POA: Diagnosis not present

## 2023-12-26 DIAGNOSIS — M25612 Stiffness of left shoulder, not elsewhere classified: Secondary | ICD-10-CM | POA: Diagnosis not present

## 2024-01-07 ENCOUNTER — Other Ambulatory Visit: Payer: Self-pay | Admitting: Podiatry

## 2024-01-07 MED ORDER — GABAPENTIN 100 MG PO CAPS: 100.0000 mg | ORAL_CAPSULE | Freq: Every day | ORAL | 3 refills | Status: AC

## 2024-01-07 NOTE — Progress Notes (Signed)
 Prescription request was sent by [facility] in [facility]  at the [employer] location to renew her gabapentin  100 mg capsules that she takes at night along with her 600 mg capsule.  The 100 mg evening capsule was sent to this pharmacy today.
